# Patient Record
Sex: Male | Born: 1980 | Hispanic: Yes | Smoking: Former smoker
Health system: Southern US, Community
[De-identification: ages and names within clinical notes are randomized; demographics above are authoritative.]

## PROBLEM LIST (undated history)

## (undated) DIAGNOSIS — M791 Myalgia, unspecified site: Secondary | ICD-10-CM

## (undated) DIAGNOSIS — E079 Disorder of thyroid, unspecified: Secondary | ICD-10-CM

## (undated) HISTORY — DX: Myalgia, unspecified site: M79.10

## (undated) HISTORY — DX: Disorder of thyroid, unspecified: E07.9

---

## 2008-05-30 ENCOUNTER — Ambulatory Visit: Payer: Self-pay | Admitting: Family Medicine

## 2008-05-30 DIAGNOSIS — E039 Hypothyroidism, unspecified: Secondary | ICD-10-CM | POA: Insufficient documentation

## 2008-06-01 ENCOUNTER — Encounter: Payer: Self-pay | Admitting: Family Medicine

## 2008-06-01 LAB — CONVERTED CEMR LAB
TSH: 1.448 microintl units/mL (ref 0.350–4.50)
Total CK: 194 units/L (ref 7–232)

## 2008-06-27 ENCOUNTER — Encounter: Admission: RE | Admit: 2008-06-27 | Discharge: 2008-06-27 | Payer: Self-pay | Admitting: Family Medicine

## 2008-06-27 ENCOUNTER — Ambulatory Visit: Payer: Self-pay | Admitting: Family Medicine

## 2008-06-27 DIAGNOSIS — F329 Major depressive disorder, single episode, unspecified: Secondary | ICD-10-CM

## 2008-08-01 ENCOUNTER — Encounter: Admission: RE | Admit: 2008-08-01 | Discharge: 2008-08-01 | Payer: Self-pay | Admitting: Family Medicine

## 2008-08-01 ENCOUNTER — Ambulatory Visit: Payer: Self-pay | Admitting: Family Medicine

## 2008-08-01 DIAGNOSIS — M461 Sacroiliitis, not elsewhere classified: Secondary | ICD-10-CM

## 2009-04-07 ENCOUNTER — Ambulatory Visit: Payer: Self-pay | Admitting: Family Medicine

## 2009-04-21 ENCOUNTER — Encounter: Payer: Self-pay | Admitting: Family Medicine

## 2009-04-22 LAB — CONVERTED CEMR LAB
ALT: 131 units/L — ABNORMAL HIGH (ref 0–53)
AST: 38 units/L — ABNORMAL HIGH (ref 0–37)
Albumin: 4.6 g/dL (ref 3.5–5.2)
Calcium: 9.7 mg/dL (ref 8.4–10.5)
Chloride: 104 meq/L (ref 96–112)
Creatinine, Ser: 0.95 mg/dL (ref 0.40–1.50)
Potassium: 4.2 meq/L (ref 3.5–5.3)
TSH: 1.578 microintl units/mL (ref 0.350–4.500)
Total CHOL/HDL Ratio: 5.4

## 2009-04-24 ENCOUNTER — Encounter: Payer: Self-pay | Admitting: Family Medicine

## 2009-04-24 ENCOUNTER — Telehealth: Payer: Self-pay | Admitting: Family Medicine

## 2009-04-24 DIAGNOSIS — R748 Abnormal levels of other serum enzymes: Secondary | ICD-10-CM | POA: Insufficient documentation

## 2009-04-25 LAB — CONVERTED CEMR LAB
ALT: 103 units/L — ABNORMAL HIGH (ref 0–53)
Albumin: 4.8 g/dL (ref 3.5–5.2)
Alkaline Phosphatase: 98 units/L (ref 39–117)
HCV Ab: NEGATIVE
Hepatitis B Surface Ag: NEGATIVE
Total Protein: 7.9 g/dL (ref 6.0–8.3)

## 2009-04-28 ENCOUNTER — Encounter: Payer: Self-pay | Admitting: Family Medicine

## 2009-04-28 ENCOUNTER — Encounter: Admission: RE | Admit: 2009-04-28 | Discharge: 2009-04-28 | Payer: Self-pay | Admitting: Family Medicine

## 2009-04-28 DIAGNOSIS — K7689 Other specified diseases of liver: Secondary | ICD-10-CM

## 2009-04-28 DIAGNOSIS — K760 Fatty (change of) liver, not elsewhere classified: Secondary | ICD-10-CM | POA: Insufficient documentation

## 2009-06-01 ENCOUNTER — Ambulatory Visit: Payer: Self-pay | Admitting: Family Medicine

## 2009-10-06 ENCOUNTER — Ambulatory Visit: Payer: Self-pay | Admitting: Family Medicine

## 2009-10-09 ENCOUNTER — Encounter (INDEPENDENT_AMBULATORY_CARE_PROVIDER_SITE_OTHER): Payer: Self-pay | Admitting: *Deleted

## 2009-10-09 LAB — CONVERTED CEMR LAB
ALT: 217 units/L — ABNORMAL HIGH (ref 0–53)
AST: 64 units/L — ABNORMAL HIGH (ref 0–37)
Albumin: 5 g/dL (ref 3.5–5.2)
TSH: 2.098 microintl units/mL (ref 0.350–4.500)
Total Bilirubin: 0.8 mg/dL (ref 0.3–1.2)
Total Protein: 7.8 g/dL (ref 6.0–8.3)

## 2009-11-01 ENCOUNTER — Ambulatory Visit: Payer: Self-pay | Admitting: Family Medicine

## 2009-11-01 DIAGNOSIS — J0100 Acute maxillary sinusitis, unspecified: Secondary | ICD-10-CM

## 2009-11-06 ENCOUNTER — Ambulatory Visit: Payer: Self-pay | Admitting: Gastroenterology

## 2009-11-06 DIAGNOSIS — K5900 Constipation, unspecified: Secondary | ICD-10-CM | POA: Insufficient documentation

## 2009-11-09 LAB — CONVERTED CEMR LAB
A-1 Antitrypsin, Ser: 152 mg/dL (ref 83–200)
Angiotensin 1 Converting Enzyme: 38 units/L (ref 9–67)
Anti Nuclear Antibody(ANA): NEGATIVE
Ceruloplasmin: 30 mg/dL (ref 21–63)

## 2009-12-22 ENCOUNTER — Ambulatory Visit: Payer: Self-pay | Admitting: Gastroenterology

## 2010-12-04 NOTE — Assessment & Plan Note (Signed)
Summary: FATTY LIVER DISEASE/ELEVATED LFT'S--[facility]   History of Present Illness Visit Type: Initial Consult Primary GI MD: Elie Goody MD [facility] Primary Provider: Seymour Bars, DO Requesting Provider: Seymour Bars, DO Chief Complaint:  elevated LFTS c/o  gas, constipation History of Present Illness:   This is a 30 year old male with elevated liver function tests: AST of 30 and ALT of 131 in June 2010, AST of 64 and ALT of 217 in December 2010. Abdominal ultrasound showed findings consistent with fatty infiltration of the liver. He states for about 2-3 years he would drink heavily once a month, but he discontinued doing this about one year ago, and now only drinks an occasional alcoholic beverage. He has gained approximately 60 pounds since moving to the [place] from [place] in 2005. His triglycerides and cholesterol were elevated when they were last checked by Dr. Cathey Endow. Serologies for hepatitis B and C were negative. He relates intermittent problems with constipation and intestinal gas. He denies any change in stool caliber or rectal bleeding.   GI Review of Systems    Reports bloating.      Denies abdominal pain, acid reflux, belching, chest pain, dysphagia with liquids, dysphagia with solids, heartburn, loss of appetite, nausea, vomiting, vomiting blood, and  weight loss.      Reports change in bowel habits, constipation, and  liver problems.     Denies anal fissure, black tarry stools, diarrhea, diverticulosis, fecal incontinence, heme positive stool, hemorrhoids, irritable bowel syndrome, jaundice, light color stool, rectal bleeding, and  rectal pain.   Current Medications (verified): 1)  Levothyroxine Sodium 75 Mcg  Tabs (Levothyroxine Sodium) .... Take One Tab By Mouth Once Daily 2)  Fluoxetine Hcl 20 Mg Tabs (Fluoxetine Hcl) .Marland Kitchen.. 1 Tab By Mouth Daily 3)  Amoxicillin 875 Mg Tabs (Amoxicillin) .Marland Kitchen.. 1 Tab By Mouth Two Times A Day X 10 Days  Allergies (verified): 1)  Cymbalta  (Duloxetine Hcl)  Past History:  Past Surgical History: Last updated: 05/30/2008 none  Past Medical History: Hypothyroidism Depression Hepatic steatosis  Family History: Reviewed history from 04/07/2009 and no changes required. 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  Finished HS.  Moved to [place] from [place] in 2005. Has a 64 yo daughter who lives with her mom. Lives alone. Runs/ Lifts 2 x a wk. Never smoked. Rare ETOH use. Weight stable with fair diet. Illicit Drug Use - no Daily Caffeine Use  2 cups per day Drug Use:  no  Review of Systems       The patient complains of back pain, muscle pains/cramps, nosebleeds, sleeping problems, and vision changes.         The pertinent positives and negatives are noted as above and in the HPI. All other ROS were reviewed and were negative.   Vital Signs:  Patient profile:   30 year old male Height:      66 inches Weight:      193.38 pounds BMI:     31.33 Pulse rate:   68 / minute Pulse rhythm:   regular BP sitting:   130 / 84  (left arm)  Vitals Entered By: Milford Cage NCMA (November 06, 2009 1:47 PM)  Physical Exam  General:  Well developed, well nourished, no acute distress. obese.   Head:  Normocephalic and atraumatic. Eyes:  PERRLA, no icterus. Ears:  Normal auditory acuity. Mouth:  No deformity or  lesions, dentition normal. Neck:  Supple; no masses or thyromegaly. Lungs:  Clear throughout to auscultation. Heart:  Regular rate and rhythm; no murmurs, rubs,  or bruits. Abdomen:  Soft, nontender and nondistended. No masses, hepatosplenomegaly or hernias noted. Normal bowel sounds. Msk:  Symmetrical with no gross deformities. Normal posture. Pulses:  Normal pulses noted. Extremities:  No clubbing, cyanosis, edema or deformities noted. Neurologic:  Alert and  oriented x4;  grossly normal neurologically. Cervical Nodes:   No significant cervical adenopathy. Inguinal Nodes:  No significant inguinal adenopathy. Psych:  Alert and cooperative. Normal mood and affect.   Impression & Recommendations:  Problem # 1:  OTHER NONSPECIFIC ABNORMAL SERUM ENZYME LEVELS (ICD-790.5) This is very likely secondary to hepatic steatosis. Rule out other causes of elevated liver function tests. He will need a long-term weight loss program and management of his hyperlipidemia, through his primary care physician, to address his fatty liver. He is advised to discontinue alcohol usage and begin a low-fat, weight loss diet. Orders: TLB-Iron, (Fe) Total (83540-FE) TLB-IBC Pnl (Iron/FE;Transferrin) (83550-IBC) TLB-Ferritin (82728-FER) TLB-PT (Protime) (85610-PTP) T-Alpha-1-Antitrypsin Tot (06237-62831) T-AMA [phone]) T-ANA [phone]) T-Anti SMA (62703-50093) T-Ceruloplasmin [phone]) T-Angiotensin i-Converting Enzyme ([id number])  Patient Instructions: 1)  Get your labs drawn today in the basement.  2)  Constipation  brochure given.  3)  Copy sent to : Seymour Bars, DO 4)  The medication list was reviewed and reconciled.  All changed / newly prescribed medications were explained.  A complete medication list was provided to the patient / caregiver.

## 2010-12-04 NOTE — Assessment & Plan Note (Signed)
Summary: 4-6 WEEK F/U..AM.   History of Present Illness Visit Type: Follow-up Visit Primary GI MD: Elie Goody MD [facility] Primary Provider: Seymour Bars, DO Requesting Provider: Seymour Bars, DO Chief Complaint: follow-up fatty liver disease and elevated LFTs History of Present Illness:   This is a return visit for abnormal liver function tests. Serologic workup was only remarkable for an elevated ferritin of 446. Iron studies were normal. Ultrasound imaging revealed fatty infiltration of the liver   GI Review of Systems      Denies abdominal pain, acid reflux, belching, bloating, chest pain, dysphagia with liquids, dysphagia with solids, heartburn, loss of appetite, nausea, vomiting, vomiting blood, weight loss, and  weight gain.        Denies anal fissure, black tarry stools, change in bowel habit, constipation, diarrhea, diverticulosis, fecal incontinence, heme positive stool, hemorrhoids, irritable bowel syndrome, jaundice, light color stool, liver problems, rectal bleeding, and  rectal pain.   Current Medications (verified): 1)  Levothyroxine Sodium 75 Mcg  Tabs (Levothyroxine Sodium) .... Take One Tab By Mouth Once Daily 2)  Fluoxetine Hcl 20 Mg Tabs (Fluoxetine Hcl) .Marland Kitchen.. 1 Tab By Mouth Daily  Allergies (verified): 1)  Cymbalta (Duloxetine Hcl)  Past History:  Past Medical History: Reviewed history from 11/06/2009 and no changes required. Hypothyroidism Depression Hepatic steatosis  Past Surgical History: Reviewed history from 05/30/2008 and no changes required. none  Family History: M high chol F HTN 6 B and 1S healthy GF prostate cancer, died No FH of Colon Cancer:  Social History: Reviewed history from 11/06/2009 and no changes required. 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  Moved to [place] from [place] in 2005. Has a 79 yo daughter who lives with her mom. Lives alone. Runs/ Lifts 2 x a wk. Never smoked. Rare ETOH  use. Weight stable with fair diet. Illicit Drug Use - no Daily Caffeine Use  2 cups per day  Review of Systems       The pertinent positives and negatives are noted as above and in the HPI. All other ROS were reviewed and were negative.   Vital Signs:  Patient profile:   30 year old male Height:      66 inches Weight:      192.50 pounds BMI:     31.18 Pulse rate:   68 / minute Pulse rhythm:   regular BP sitting:   112 / 82  (left arm)  Vitals Entered By: Milford Cage NCMA (December 22, 2009 8:21 AM)  Physical Exam  General:  Well developed, well nourished, no acute distress. Head:  Normocephalic and atraumatic. Mouth:  No deformity or lesions, dentition normal. Lungs:  Clear throughout to auscultation. Heart:  Regular rate and rhythm; no murmurs, rubs,  or bruits. Abdomen:  Soft, nontender and nondistended. No masses, hepatosplenomegaly or hernias noted. Normal bowel sounds. Psych:  Alert and cooperative. Normal mood and affect.  Impression & Recommendations:  Problem # 1:  FATTY LIVER DISEASE (ICD-571.8) Hepatic steatosis leading to abnormal liver function tests. He is advised to maintain a low-fat diet, begin a regular exercise regimen and lose weight to achieve his ideal body weight. He is advised also to minimize alcohol. His primary physician will also supervise the above plan and I will see him for annual office visits. It would be reasonable to follow his liver function tests every 6 months for now. Will plan to repeat his iron studies and ferritin in one year given his elevated ferritin.  Patient Instructions: 1)  Fatty Liver handout given.  2)   Low Fat  Healthy Eating Plan brochure given.  3)  Copy sent to : Seymour Bars, DO 4)  Please schedule a follow-up appointment in 1 year. 5)  The medication list was reviewed and reconciled.  All changed / newly prescribed medications were explained.  A complete medication list was provided to the patient / caregiver.

## 2011-01-04 ENCOUNTER — Encounter: Payer: Self-pay | Admitting: Family Medicine

## 2011-01-04 ENCOUNTER — Encounter (INDEPENDENT_AMBULATORY_CARE_PROVIDER_SITE_OTHER): Payer: 59 | Admitting: Family Medicine

## 2011-01-04 DIAGNOSIS — Z0000 Encounter for general adult medical examination without abnormal findings: Secondary | ICD-10-CM

## 2011-01-10 NOTE — Assessment & Plan Note (Signed)
Summary: CPE   Vital Signs:  Patient profile:   30 year old male Height:      66 inches Weight:      196 pounds BMI:     31.75 O2 Sat:      98 % on Room air Pulse rate:   65 / minute BP sitting:   124 / 80  (left arm) Cuff size:   large  Vitals Entered By: Payton Spark CMA (January 04, 2011 10:04 AM)  O2 Flow:  Room air CC: CPE   Primary Care Provider:  Seymour Bars, DO  CC:  CPE.  History of Present Illness: 30 yo HM presents for CPE.  Doing well.  Hx of depression, hypothyroidism and fatty liver dz.  Has not been eating healthy or exercising but has a physically active job.  Tetanus updated 2010.  Due for fasting labs and TSH.  Stopped his Levothyroxine after running out last month and reports feeling better off Fluoxetine and Levothyroxine.  Denies fatigue, constipation or wt gain.  Sciatica has improved after seeing the chiropractor.  Denies fam hx of colon or prostate cancer or of premature heart dz.    Current Medications (verified): 1)  None  Allergies (verified): 1)  Cymbalta (Duloxetine Hcl)  Past History:  Past Medical History: Reviewed history from 11/06/2009 and no changes required. Hypothyroidism Depression Hepatic steatosis  Past Surgical History: Reviewed history from 05/30/2008 and no changes required. none  Family History: Reviewed history from 12/22/2009 and no changes required. M high chol F HTN 6 B and 1S healthy GF prostate cancer, died No FH of Colon Cancer:  Social History: Reviewed history from 11/06/2009 and no changes required. 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  Moved to [place] from [place] in 2005. Has a 51 yo daughter who lives with her mom. Lives alone. Runs/ Lifts 2 x a wk. Never smoked. Rare ETOH use. Weight stable with fair diet. Illicit Drug Use - no Daily Caffeine Use  2 cups per day  Review of Systems  The patient denies anorexia, fever, weight loss, weight gain, vision loss,  decreased hearing, hoarseness, chest pain, syncope, dyspnea on exertion, peripheral edema, prolonged cough, headaches, hemoptysis, abdominal pain, melena, hematochezia, severe indigestion/heartburn, hematuria, incontinence, genital sores, muscle weakness, suspicious skin lesions, transient blindness, difficulty walking, depression, unusual weight change, abnormal bleeding, enlarged lymph nodes, angioedema, breast masses, and testicular masses.    Physical Exam  General:  alert, well-developed, well-nourished, well-hydrated, and overweight-appearing.   Head:  normocephalic and atraumatic.   Eyes:  pupils equal, pupils round, and pupils reactive to light.   Ears:  EACs patent; TMs translucent and gray with good cone of light and bony landmarks.  Nose:  no nasal discharge.   Mouth:  good dentition and pharynx pink and moist.   Neck:  no masses.   Lungs:  Normal respiratory effort, chest expands symmetrically. Lungs are clear to auscultation, no crackles or wheezes. Heart:  Normal rate and regular rhythm. S1 and S2 normal without gallop, murmur, click, rub or other extra sounds. Abdomen:  soft, non-tender, normal bowel sounds, no distention, no masses, no guarding, no hepatomegaly, and no splenomegaly.   Pulses:  2+ radial and pedal pulses Extremities:  no LE edema Skin:  color normal.   Cervical Nodes:  No lymphadenopathy noted Psych:  good eye contact, not anxious appearing, and not depressed appearing.     Impression & Recommendations:  Problem # 1:  HEALTH MAINTENANCE EXAM (  ICD-V70.0) Keeping healthy checklist for men reviewed. BMI 31 c/w class I obesity. BP at goal.  Hx fatty liver dz. Immunizations UTD. Update fasting labs. Work on [employer], regular exercise, wt loss. Mood stable off Fluoxetine. Thyroid- off meds, due for TSH.  Other Orders: T-Comprehensive Metabolic Panel [phone]) T-Lipid Profile ([id number]) T-TSH [id number])  Patient Instructions: 1)  Update  fasting labs one morning (M-F 8-5), 8 hrs fasting. 2)  Will call you w/ results the following day. 3)  Return for f/u WT/ mood in 3-4 mos.   Orders Added: 1)  T-Comprehensive Metabolic Panel [[id number]] 2)  T-Lipid Profile [[id number]] 3)  T-TSH [[id number]] 4)  Est. Patient age 54-39 [phone]

## 2012-03-03 ENCOUNTER — Ambulatory Visit (INDEPENDENT_AMBULATORY_CARE_PROVIDER_SITE_OTHER): Payer: 59 | Admitting: Family Medicine

## 2012-03-03 ENCOUNTER — Encounter: Payer: Self-pay | Admitting: Family Medicine

## 2012-03-03 VITALS — BP 139/89 | HR 70 | Ht 66.0 in | Wt 195.0 lb

## 2012-03-03 DIAGNOSIS — Z3009 Encounter for other general counseling and advice on contraception: Secondary | ICD-10-CM

## 2012-03-03 DIAGNOSIS — Z0000 Encounter for general adult medical examination without abnormal findings: Secondary | ICD-10-CM

## 2012-03-03 LAB — POCT URINALYSIS DIPSTICK
Blood, UA: NEGATIVE
Leukocytes, UA: NEGATIVE
Nitrite, UA: NEGATIVE
Protein, UA: NEGATIVE
pH, UA: 6.5

## 2012-03-03 NOTE — Progress Notes (Signed)
Subjective:    Patient ID: Tyrone Johnson, male    DOB: 1981/02/05, 31 y.o.   MRN: [id number]  HPI He is here for physical examination. History the past of a thyroid inactivity and mother has borderline diabetes.   Review of Systems  Musculoskeletal: Positive for myalgias and arthralgias.       He sees a Land or basis for joint manipulation.  Psychiatric/Behavioral:       He reports his wife is concerned that he may be bipolar and if there is anyway he can be tested for it.  All other systems reviewed and are negative.    Allergies  Allergen Reactions  . Duloxetine     REACTION: palpitations   History   Social History  . Marital Status: Married    Spouse Name: N/A    Number of Children: N/A  . Years of Education: N/A   Occupational History  . Not on file.   Social History Main Topics  . Smoking status: Former Games developer  . Smokeless tobacco: Never Used  . Alcohol Use: Not on file  . Drug Use: No  . Sexually Active: Yes    Birth Control/ Protection: Condom, Rhythm   Other Topics Concern  . Not on file   Social History Narrative  . No narrative on file   Family History  Problem Relation Age of Onset  . Diabetes Mother   . Ulcers Father   . Eczema Son    Past Medical History  Diagnosis Date  . Thyroid disease   . Myalgia    History reviewed. No pertinent past surgical history.     BP 139/89  Pulse 70  Ht 5\' 6"  (1.676 m)  Wt 195 lb (88.451 kg)  BMI 31.47 kg/m2  SpO2 100% Objective:   Physical Exam  Constitutional: He is oriented to person, place, and time. He appears well-developed and well-nourished.  HENT:  Head: Normocephalic and atraumatic.  Right Ear: External ear normal.  Left Ear: External ear normal.  Nose: Nose normal.  Mouth/Throat: Oropharynx is clear and moist.  Eyes: Conjunctivae are normal. Pupils are equal, round, and reactive to light.  Fundoscopic exam:      The right eye shows no arteriolar narrowing, no AV nicking,  no exudate and no hemorrhage.       The left eye shows no arteriolar narrowing, no AV nicking, no exudate and no hemorrhage.  Neck: Normal range of motion. Neck supple. No tracheal deviation present. No thyromegaly present.  Cardiovascular: Normal rate and normal heart sounds.  Exam reveals no gallop.   No murmur heard. Pulmonary/Chest: Effort normal and breath sounds normal. No respiratory distress. He has no wheezes. He exhibits no tenderness.  Abdominal: Soft. Bowel sounds are normal. He exhibits no distension. There is no tenderness. Hernia confirmed negative in the right inguinal area and confirmed negative in the left inguinal area.  Genitourinary: Testes normal and penis normal. Uncircumcised. No penile tenderness.       Per his preference we'll hold off on rectal exam and prostate exam until over 30.  Musculoskeletal: Normal range of motion. He exhibits no edema.       Mild right lumbar muscle tenderness to palpation.  Lymphadenopathy:    He has no cervical adenopathy.  Neurological: He is alert and oriented to person, place, and time. He has normal reflexes. No cranial nerve deficit.  Skin: Skin is warm and dry. No erythema.  Psychiatric: He has a normal mood and affect. His  behavior is normal.      Mental health evaluation for bipolar was positive for 8 much the neck her multiple times or together and he rates it as being minor so he would not qualify for mental health referral at this time unless things changed.     Results for orders placed in visit on 03/03/12  POCT URINALYSIS DIPSTICK      Component Value Range   Color, UA yellow     Clarity, UA clear     Glucose, UA neg     Bilirubin, UA neg     Ketones, UA neg     Spec Grav, UA 1.010     Blood, UA neg     pH, UA 6.5     Protein, UA neg     Urobilinogen, UA 0.2     Nitrite, UA neg     Leukocytes, UA Negative     Assessment & Plan:  #1 health maintenance. Will obtain lipid, A1c, CMP, CBC, testosterone and  TSH. #2 referral for vasectomy counseling done. #3 concern about bipolar symptoms by his wife. At this time spent to if symptoms gets worse please return. #4 myalgia/joint pain. Continue following up with chiropractor We'll see back in the office in 12-15 months for PE

## 2012-03-03 NOTE — Patient Instructions (Signed)
Vasectomy A vasectomy is tying (with or without cutting) the tube that collects the sperm from the testicle (vas deferens). The vasectomy blocks the sperm from going through the vas deferens and penis so that during sexual intercourse, the sperm does not go into the vagina. Vasectomy is safe, with very rare complications. It does not affect your sexual desire or performance.  Since vasectomy is considered permanent, you should not have it done until you are sure you do not want any more children. You and your partner should be in full agreement to have the procedure. Your decision to have a vasectomy should not be made during a stressful situation. This includes loss of a pregnancy, illness, death of a spouse or divorce. There are other means of contraception that can be used until you are completely sure you want this procedure done. A vasectomy does not protect you from sexually transmitted disease. Men who want the vasectomy reversed need an operation. It may not be successful. A vasectomy has less risk and is less expensive than a woman having a tubal sterilization. LET YOUR CAREGIVER KNOW ABOUT:   Allergies.   Medications taken including herbs, eye drops, over the counter medications, and creams.   Use of steroids (by mouth or creams).   Past problems with anesthetics or Novocaine.   History of blood clots (thrombophlebitis).   History of bleeding or blood problems.   Past surgery.  RISKS AND COMPLICATIONS  Failure of the procedure to cause infertility. This means you would still be able to get a male pregnant.   Post-operative pain. This can usually be controlled with medicine.   Infection. A germ starts growing in the wound. This can usually be treated with antibiotics.   An allergic reaction to the anesthetic or other medicine given.   Bleeding. Blood may seep under the skin so that the scrotum and penis appear to be bruised. Sometimes the scrotum can swell and get the size of  a grapefruit. This usually disappears without treatment within a week or two.  PROCEDURE  The scrotum is cleaned with bacterial killing soap and the caregiver finds the vas deferens.   Each side of the scrotum is numbed.   A very small cut (incision) is made, and the vas deferens are pulled out of the scrotum. The vas deferens are then tied off, cut or may be burned (cauterized) at the ends.   Sometimes the vas deferens are pulled out from the scrotum through a puncture wound. This is done with a special instrument without an incision.   The vas deferens are then put back into the scrotum, and the incision or puncture wound is closed.   After surgery, sperm may still be left in the vas deferens for 1 to 3 months. Because of this, other means of contraception should be used until your caregiver examines you and finds there are no sperm in your seminal fluid.   Castration is another method that is the surgical removal of both testicles. It causes sterilization in men.  Having a vasectomy should be discussed with your care giver with you and your partner present. Ask questions and talk about your concerns with your caregiver. Then, you can decide if the operation is safe for you. You can change your mind and cancel the surgery at any time. HOME CARE INSTRUCTIONS   Only take over-the-counter or prescription medicines for pain, discomfort or fever as directed by your caregiver.   An ice pack for 15 to 20 minutes,   3 to 4 times per day will help decrease swelling.   Wearing supportive briefs or an athletic supporter will help decrease swelling.   Avoid being active for the first two days after surgery.   Keep the incisions clean and covered to prevent infection.   Some oozing of blood from the cut (incision) is normal during the first day or two after surgery.   Do not participate in sports or do heavy physical labor for at least two weeks.   Follow your caregivers instructions regarding  diet, rest, work, social and sexual activities and follow up appointments.  SEEK IMMEDIATE MEDICAL CARE IF:   There is redness, swelling, or increasing pain in the wounds or testicles.   Pus is coming from wound.   An unexplained oral temperature above 102 F (38.9 C) develops, or as directed by your caregiver.   You notice a bad smell coming from the wound or material covering the wound (dressing).   There is a breaking open of the stitches (suture) or wound edges even after sutures have been removed. Sometimes these incisions are not sutured.   There is increased bleeding from the wounds.  MAKE SURE YOU:   Understand these instructions.   Will watch your condition.   Will get help right away if you are not doing well or get worse.  Document Released: 01/11/2003 Document Revised: 10/10/2011 Document Reviewed: 03/08/2008 [employer] Patient Information 2012 [employer]. 

## 2012-03-04 LAB — COMPREHENSIVE METABOLIC PANEL
AST: 35 U/L (ref 0–37)
Albumin: 4.7 g/dL (ref 3.5–5.2)
BUN: 13 mg/dL (ref 6–23)
Calcium: 9.7 mg/dL (ref 8.4–10.5)
Chloride: 104 mEq/L (ref 96–112)
Glucose, Bld: 90 mg/dL (ref 70–99)
Potassium: 4.3 mEq/L (ref 3.5–5.3)
Sodium: 144 mEq/L (ref 135–145)
Total Protein: 7.1 g/dL (ref 6.0–8.3)

## 2012-03-04 LAB — LIPID PANEL
HDL: 42 mg/dL (ref >39)
LDL Cholesterol: 148 mg/dL — ABNORMAL HIGH (ref 0–99)
Triglycerides: 203 mg/dL — ABNORMAL HIGH (ref <150)

## 2012-03-04 LAB — CBC
Hemoglobin: 15 g/dL (ref 13.0–17.0)
MCH: 31.1 pg (ref 26.0–34.0)
Platelets: 201 10*3/uL (ref 150–400)
RBC: 4.82 MIL/uL (ref 4.22–5.81)
WBC: 5.4 10*3/uL (ref 4.0–10.5)

## 2012-03-04 LAB — HEMOGLOBIN A1C: Hgb A1c MFr Bld: 5.5 % (ref <5.7)

## 2012-03-05 ENCOUNTER — Encounter: Payer: Self-pay | Admitting: Family Medicine

## 2013-01-15 ENCOUNTER — Ambulatory Visit (INDEPENDENT_AMBULATORY_CARE_PROVIDER_SITE_OTHER): Payer: 59 | Admitting: Physician Assistant

## 2013-01-15 ENCOUNTER — Encounter: Payer: Self-pay | Admitting: Physician Assistant

## 2013-01-15 VITALS — BP 130/81 | HR 87 | Wt 204.0 lb

## 2013-01-15 DIAGNOSIS — R748 Abnormal levels of other serum enzymes: Secondary | ICD-10-CM

## 2013-01-15 DIAGNOSIS — R5381 Other malaise: Secondary | ICD-10-CM

## 2013-01-15 DIAGNOSIS — E785 Hyperlipidemia, unspecified: Secondary | ICD-10-CM

## 2013-01-15 DIAGNOSIS — F411 Generalized anxiety disorder: Secondary | ICD-10-CM

## 2013-01-15 DIAGNOSIS — F329 Major depressive disorder, single episode, unspecified: Secondary | ICD-10-CM

## 2013-01-15 NOTE — Patient Instructions (Addendum)
Really need to focus on exercise and low fat diet. Will call with lab results.    Fat and Cholesterol Control Diet Cholesterol levels in your body are determined significantly by your diet. Cholesterol levels may also be related to heart disease. The following material helps to explain this relationship and discusses what you can do to help keep your heart healthy. Not all cholesterol is bad. Low-density lipoprotein (LDL) cholesterol is the "bad" cholesterol. It may cause fatty deposits to build up inside your arteries. High-density lipoprotein (HDL) cholesterol is "good." It helps to remove the "bad" LDL cholesterol from your blood. Cholesterol is a very important risk factor for heart disease. Other risk factors are high blood pressure, smoking, stress, heredity, and weight. The heart muscle gets its supply of blood through the coronary arteries. If your LDL cholesterol is high and your HDL cholesterol is low, you are at risk for having fatty deposits build up in your coronary arteries. This leaves less room through which blood can flow. Without sufficient blood and oxygen, the heart muscle cannot function properly and you may feel chest pains (angina pectoris). When a coronary artery closes up entirely, a part of the heart muscle may die causing a heart attack (myocardial infarction). CHECKING CHOLESTEROL When your caregiver sends your blood to a lab to be examined for cholesterol, a complete lipid (fat) profile may be done. With this test, the total amount of cholesterol and levels of LDL and HDL are determined. Triglycerides are a type of fat that circulates in the blood. They can also be used to determine heart disease risk. The list below describes what the numbers should be: Test: Total Cholesterol.  Less than 200 mg/dl. Test: LDL "bad cholesterol."  Less than 100 mg/dl.  Less than 70 mg/dl if you are at very high risk of a heart attack or sudden cardiac death. Test: HDL "good  cholesterol."  Greater than 50 mg/dl for women.  Greater than 40 mg/dl for men. Test: Triglycerides.  Less than 150 mg/dl. CONTROLLING CHOLESTEROL WITH DIET Although exercise and lifestyle factors are important, your diet is key. That is because certain foods are known to raise cholesterol and others to lower it. The goal is to balance foods for their effect on cholesterol and more importantly, to replace saturated and trans fat with other types of fat, such as monounsaturated fat, polyunsaturated fat, and omega-3 fatty acids. On average, a person should consume no more than 15 to 17 g of saturated fat daily. Saturated and trans fats are considered "bad" fats, and they will raise LDL cholesterol. Saturated fats are primarily found in animal products such as meats, butter, and cream. However, that does not mean you need to give up all your favorite foods. Today, there are good tasting, low-fat, low-cholesterol substitutes for most of the things you like to eat. Choose low-fat or nonfat alternatives. Choose round or loin cuts of red meat. These types of cuts are lowest in fat and cholesterol. Chicken (without the skin), fish, veal, and ground [place] breast are great choices. Eliminate fatty meats, such as hot dogs and salami. Even shellfish have little or no saturated fat. Have a 3 oz (85 g) portion when you eat lean meat, poultry, or fish. Trans fats are also called "partially hydrogenated oils." They are oils that have been scientifically manipulated so that they are solid at room temperature resulting in a longer shelf life and improved taste and texture of foods in which they are added. Trans fats are  found in stick margarine, some tub margarines, cookies, crackers, and baked goods.  When baking and cooking, oils are a great substitute for butter. The monounsaturated oils are especially beneficial since it is believed they lower LDL and raise HDL. The oils you should avoid entirely are saturated  tropical oils, such as coconut and palm.  Remember to eat a lot from food groups that are naturally free of saturated and trans fat, including fish, fruit, vegetables, beans, grains (barley, rice, couscous, bulgur wheat), and pasta (without cream sauces).  IDENTIFYING FOODS THAT LOWER CHOLESTEROL  Soluble fiber may lower your cholesterol. This type of fiber is found in fruits such as apples, vegetables such as broccoli, potatoes, and carrots, legumes such as beans, peas, and lentils, and grains such as barley. Foods fortified with plant sterols (phytosterol) may also lower cholesterol. You should eat at least 2 g per day of these foods for a cholesterol lowering effect.  Read package labels to identify low-saturated fats, trans fat free, and low-fat foods at the supermarket. Select cheeses that have only 2 to 3 g saturated fat per ounce. Use a heart-healthy tub margarine that is free of trans fats or partially hydrogenated oil. When buying baked goods (cookies, crackers), avoid partially hydrogenated oils. Breads and muffins should be made from whole grains (whole-wheat or whole oat flour, instead of "flour" or "enriched flour"). Buy non-creamy canned soups with reduced salt and no added fats.  FOOD PREPARATION TECHNIQUES  Never deep-fry. If you must fry, either stir-fry, which uses very little fat, or use non-stick cooking sprays. When possible, broil, bake, or roast meats, and steam vegetables. Instead of putting butter or margarine on vegetables, use lemon and herbs, applesauce, and cinnamon (for squash and sweet potatoes), nonfat yogurt, salsa, and low-fat dressings for salads.  LOW-SATURATED FAT / LOW-FAT FOOD SUBSTITUTES Meats / Saturated Fat (g)  Avoid: Steak, marbled (3 oz/85 g) / 11 g  Choose: Steak, lean (3 oz/85 g) / 4 g  Avoid: Hamburger (3 oz/85 g) / 7 g  Choose: Hamburger, lean (3 oz/85 g) / 5 g  Avoid: Ham (3 oz/85 g) / 6 g  Choose: Ham, lean cut (3 oz/85 g) / 2.4 g  Avoid:  Chicken, with skin, dark meat (3 oz/85 g) / 4 g  Choose: Chicken, skin removed, dark meat (3 oz/85 g) / 2 g  Avoid: Chicken, with skin, light meat (3 oz/85 g) / 2.5 g  Choose: Chicken, skin removed, light meat (3 oz/85 g) / 1 g Dairy / Saturated Fat (g)  Avoid: Whole milk (1 cup) / 5 g  Choose: Low-fat milk, 2% (1 cup) / 3 g  Choose: Low-fat milk, 1% (1 cup) / 1.5 g  Choose: Skim milk (1 cup) / 0.3 g  Avoid: Hard cheese (1 oz/28 g) / 6 g  Choose: Skim milk cheese (1 oz/28 g) / 2 to 3 g  Avoid: Cottage cheese, 4% fat (1 cup) / 6.5 g  Choose: Low-fat cottage cheese, 1% fat (1 cup) / 1.5 g  Avoid: Ice cream (1 cup) / 9 g  Choose: Sherbet (1 cup) / 2.5 g  Choose: Nonfat frozen yogurt (1 cup) / 0.3 g  Choose: Frozen fruit bar / trace  Avoid: Whipped cream (1 tbs) / 3.5 g  Choose: Nondairy whipped topping (1 tbs) / 1 g Condiments / Saturated Fat (g)  Avoid: Mayonnaise (1 tbs) / 2 g  Choose: Low-fat mayonnaise (1 tbs) / 1 g  Avoid: Butter (1 tbs) /  7 g  Choose: Extra light margarine (1 tbs) / 1 g  Avoid: Coconut oil (1 tbs) / 11.8 g  Choose: Olive oil (1 tbs) / 1.8 g  Choose: Corn oil (1 tbs) / 1.7 g  Choose: Safflower oil (1 tbs) / 1.2 g  Choose: Sunflower oil (1 tbs) / 1.4 g  Choose: Soybean oil (1 tbs) / 2.4 g  Choose: Canola oil (1 tbs) / 1 g Document Released: 10/21/2005 Document Revised: 01/13/2012 Document Reviewed: 04/11/2011 [facility] Patient Information 2013 [place], [place].

## 2013-01-15 NOTE — Progress Notes (Signed)
  Subjective:    Patient ID: Tyrone Johnson, male    DOB: 29-Dec-1980, 32 y.o.   MRN: [id number]  HPI Patient is a 32 year old male who presents to the clinic to get back on the right track after stopping all medications last year. He has previously been diagnosed with hyperthyroidism, depression, elevated liver and, hyperlipidemia and has not been on any medications in the last year. He is ready to start taking care of himself and making sure everything is under control. He has noticed that he feels very tired most days. He continues to work in hopes down a job but exhausted all the time. His mood is also very up and down. He is very easily angered. He also has days where he just feels like he does only get out of bed. He denies any exercise and has not watching his diet. He denies any chest pains, palpitations, shortness of breath. He denies any suicidal thoughts or harm towards other. He has been on antidepressant in the past but he feels like it might have caused his heart to beat faster than normal. Patient request lab work.    Review of Systems  Constitutional: Positive for fatigue. Negative for fever, chills, activity change and appetite change.  HENT: Negative.   Eyes: Negative.   Respiratory: Negative.   Cardiovascular: Negative.   Gastrointestinal: Negative.   Endocrine: Negative.   Genitourinary: Negative.   Musculoskeletal: Negative.   Skin: Negative.   Allergic/Immunologic: Negative.   Neurological: Negative.   Hematological: Negative.        Objective:   Physical Exam  Constitutional: He is oriented to person, place, and time. He appears well-developed and well-nourished.  HENT:  Head: Normocephalic and atraumatic.  Eyes: Conjunctivae are normal.  Neck: Normal range of motion. Neck supple. No thyromegaly present.  Cardiovascular: Normal rate, regular rhythm and normal heart sounds.   Pulmonary/Chest: Effort normal and breath sounds normal.  Neurological: He is  alert and oriented to person, place, and time.  Skin: Skin is warm and dry.  Psychiatric: He has a normal mood and affect. His behavior is normal.          Assessment & Plan:  Hypothyroidism-we'll check TSH today and see what appropriate dose patient needs to be started at.  Fatty liver disease-patient has a history of fatty liver disease discuss with patient notes most likely why his enzymes were elevated. Will recheck liver enzymes today.   depression/anxiety-will consider medications after we have labs random. Discussed with patient possibility of testosterone imbalance. We'll check testosterone today. These symptoms also to be affected by thyroid level. Patient aware  Hyperlipidemia-the past patient LDLs were elevated. Will recheck when patient has been fasting for 8 hours. Gave lab slip to get done next week. Discussed low fat diet and regular exercise. Patient seems to understand and want to take control of his helped.

## 2013-01-19 LAB — COMPREHENSIVE METABOLIC PANEL
ALT: 135 U/L — ABNORMAL HIGH (ref 0–53)
AST: 38 U/L — ABNORMAL HIGH (ref 0–37)
Albumin: 4.7 g/dL (ref 3.5–5.2)
Alkaline Phosphatase: 89 U/L (ref 39–117)
BUN: 14 mg/dL (ref 6–23)
CO2: 29 mEq/L (ref 19–32)
Calcium: 9.5 mg/dL (ref 8.4–10.5)
Chloride: 104 mEq/L (ref 96–112)
Creat: 0.97 mg/dL (ref 0.50–1.35)
Glucose, Bld: 87 mg/dL (ref 70–99)
Potassium: 4.2 mEq/L (ref 3.5–5.3)
Sodium: 141 mEq/L (ref 135–145)
Total Bilirubin: 0.6 mg/dL (ref 0.3–1.2)
Total Protein: 7.3 g/dL (ref 6.0–8.3)

## 2013-01-19 LAB — CBC
MCV: 90.4 fL (ref 78.0–100.0)
Platelets: 223 10*3/uL (ref 150–400)
RBC: 5.01 MIL/uL (ref 4.22–5.81)
RDW: 13.9 % (ref 11.5–15.5)
WBC: 5.3 10*3/uL (ref 4.0–10.5)

## 2013-01-19 LAB — LIPID PANEL
Cholesterol: 221 mg/dL — ABNORMAL HIGH (ref 0–200)
HDL: 43 mg/dL (ref >39)
Total CHOL/HDL Ratio: 5.1 Ratio
VLDL: 33 mg/dL (ref 0–40)

## 2013-01-19 LAB — TSH: TSH: 5.964 u[IU]/mL — ABNORMAL HIGH (ref 0.350–4.500)

## 2013-01-20 ENCOUNTER — Other Ambulatory Visit: Payer: Self-pay | Admitting: Physician Assistant

## 2013-01-20 LAB — TESTOSTERONE, FREE, TOTAL, SHBG
Sex Hormone Binding: 55 nmol/L (ref 13–71)
Testosterone, Free: 99.8 pg/mL (ref 47.0–244.0)
Testosterone-% Free: 1.6 % (ref 1.6–2.9)
Testosterone: 642 ng/dL (ref 300–890)

## 2013-01-20 LAB — VITAMIN D 25 HYDROXY (VIT D DEFICIENCY, FRACTURES): Vit D, 25-Hydroxy: 18 ng/mL — ABNORMAL LOW (ref 30–89)

## 2013-01-20 MED ORDER — ERGOCALCIFEROL 1.25 MG (50000 UT) PO CAPS: 50000.0000 [IU] | ORAL_CAPSULE | ORAL | Status: DC

## 2013-01-20 MED ORDER — LEVOTHYROXINE SODIUM 75 MCG PO TABS: 75.0000 ug | ORAL_TABLET | Freq: Every day | ORAL | Status: DC

## 2013-03-01 ENCOUNTER — Ambulatory Visit (INDEPENDENT_AMBULATORY_CARE_PROVIDER_SITE_OTHER): Payer: 59 | Admitting: Physician Assistant

## 2013-03-01 ENCOUNTER — Encounter: Payer: Self-pay | Admitting: Physician Assistant

## 2013-03-01 VITALS — BP 131/88 | HR 61 | Wt 197.0 lb

## 2013-03-01 DIAGNOSIS — Z0000 Encounter for general adult medical examination without abnormal findings: Secondary | ICD-10-CM

## 2013-03-01 DIAGNOSIS — E559 Vitamin D deficiency, unspecified: Secondary | ICD-10-CM

## 2013-03-01 DIAGNOSIS — E039 Hypothyroidism, unspecified: Secondary | ICD-10-CM

## 2013-03-01 DIAGNOSIS — K7689 Other specified diseases of liver: Secondary | ICD-10-CM

## 2013-03-01 NOTE — Progress Notes (Signed)
  Subjective:    Patient ID: Tyrone Johnson, male    DOB: [birth year], 32 y.o.   MRN: [id number]  HPI Patient presents to the clinic for CPE. Labs have already been drawn and patient is aware of results.   Hyperlipidemia stable and working to improve with diet and exercise. Has lost 8lbs since last visit. Drinking more water and joined the gym.  Vitamin D defiency taking 50,000 units for 8 weeks then will recheck.   Hypothyroidism started daily levothyroxine and will recheck in next 3-4 weeks.   Fatty liver with elevated liver enzymes, persistently.   Patient has no compliants today. He has more energy and feels like his anger is more controlled and mood more even.   Social, surgical, family history updated.    Review of Systems  All other systems reviewed and are negative.       Objective:   Physical Exam  BP 131/88  Pulse 61  Wt 197 lb (89.359 kg)  BMI 31.81 kg/m2  General Appearance:    Alert, cooperative, no distress, appears stated age  Head:    Normocephalic, without obvious abnormality, atraumatic  Eyes:    PERRL, conjunctiva/corneas clear, EOM's intact, fundi    benign, both eyes       Ears:    Normal TM's and external ear canals, both ears  Nose:   Nares normal, septum midline, mucosa normal, no drainage    or sinus tenderness  Throat:   Lips, mucosa, and tongue normal; teeth and gums normal  Neck:   Supple, symmetrical, trachea midline, no adenopathy;       thyroid:  No enlargement/tenderness/nodules; no carotid   bruit or JVD  Back:     Symmetric, no curvature, ROM normal, no CVA tenderness  Lungs:     Clear to auscultation bilaterally, respirations unlabored  Chest wall:    No tenderness or deformity  Heart:    Regular rate and rhythm, S1 and S2 normal, no murmur, rub   or gallop  Abdomen:     Soft, non-tender, bowel sounds active all four quadrants,    no masses, no organomegaly  Genitalia:    Not done.   Rectal:    Not done.   Extremities:    Extremities normal, atraumatic, no cyanosis or edema  Pulses:   2+ and symmetric all extremities  Skin:   Skin color, texture, turgor normal, no rashes or lesions  Lymph nodes:   Cervical, supraclavicular, and axillary nodes normal  Neurologic:   CNII-XII intact. Normal strength, sensation and reflexes      throughout        Assessment & Plan:   CPE- Vaccines are up to date.   Hyperlipidemia- diet and exercise will continue to monitor.   Hypothyroidism/vitamin D- gave lab slip to have check within in month and will give refills accordingly.   Fatty liver- Reassured pt that elevated liver enzymes had been worked up and due to fatty liver. Losing weight can only help.

## 2013-03-01 NOTE — Patient Instructions (Addendum)
Balance diet exercise. Goal in 6 months 175.   Low Back Sprain with Rehab  A sprain is an injury in which a ligament is torn. The ligaments of the lower back are vulnerable to sprains. However, they are strong and require great force to be injured. These ligaments are important for stabilizing the spinal column. Sprains are classified into three categories. Grade 1 sprains cause pain, but the tendon is not lengthened. Grade 2 sprains include a lengthened ligament, due to the ligament being stretched or partially ruptured. With grade 2 sprains there is still function, although the function may be decreased. Grade 3 sprains involve a complete tear of the tendon or muscle, and function is usually impaired. SYMPTOMS   Severe pain in the lower back.  Sometimes, a feeling of a "pop," "snap," or tear, at the time of injury.  Tenderness and sometimes swelling at the injury site.  Uncommonly, bruising (contusion) within 48 hours of injury.  Muscle spasms in the back. CAUSES  Low back sprains occur when a force is placed on the ligaments that is greater than they can handle. Common causes of injury include:  Performing a stressful act while off-balance.  Repetitive stressful activities that involve movement of the lower back.  Direct hit (trauma) to the lower back. RISK INCREASES WITH:  Contact sports (football, wrestling).  Collisions (major skiing accidents).  Sports that require throwing or lifting (baseball, weightlifting).  Sports involving twisting of the spine (gymnastics, diving, tennis, golf).  Poor strength and flexibility.  Inadequate protection.  Previous back injury or surgery (especially fusion). PREVENTION  Wear properly fitted and padded protective equipment.  Warm up and stretch properly before activity.  Allow for adequate recovery between workouts.  Maintain physical fitness:  Strength, flexibility, and endurance.  Cardiovascular fitness.  Maintain a  healthy body weight. PROGNOSIS  If treated properly, low back sprains usually heal with non-surgical treatment. The length of time for healing depends on the severity of the injury.  RELATED COMPLICATIONS   Recurring symptoms, resulting in a chronic problem.  Chronic inflammation and pain in the low back.  Delayed healing or resolution of symptoms, especially if activity is resumed too soon.  Prolonged impairment.  Unstable or arthritic joints of the low back. TREATMENT  Treatment first involves the use of ice and medicine, to reduce pain and inflammation. The use of strengthening and stretching exercises may help reduce pain with activity. These exercises may be performed at home or with a therapist. Severe injuries may require referral to a therapist for further evaluation and treatment, such as ultrasound. Your caregiver may advise that you wear a back brace or corset, to help reduce pain and discomfort. Often, prolonged bed rest results in greater harm then benefit. Corticosteroid injections may be recommended. However, these should be reserved for the most serious cases. It is important to avoid using your back when lifting objects. At night, sleep on your back on a firm mattress, with a pillow placed under your knees. If non-surgical treatment is unsuccessful, surgery may be needed.  MEDICATION   If pain medicine is needed, nonsteroidal anti-inflammatory medicines (aspirin and ibuprofen), or other minor pain relievers (acetaminophen), are often advised.  Do not take pain medicine for 7 days before surgery.  Prescription pain relievers may be given, if your caregiver thinks they are needed. Use only as directed and only as much as you need.  Ointments applied to the skin may be helpful.  Corticosteroid injections may be given by your caregiver.  These injections should be reserved for the most serious cases, because they may only be given a certain number of times. HEAT AND  COLD  Cold treatment (icing) should be applied for 10 to 15 minutes every 2 to 3 hours for inflammation and pain, and immediately after activity that aggravates your symptoms. Use ice packs or an ice massage.  Heat treatment may be used before performing stretching and strengthening activities prescribed by your caregiver, physical therapist, or athletic trainer. Use a heat pack or a warm water soak. SEEK MEDICAL CARE IF:   Symptoms get worse or do not improve in 2 to 4 weeks, despite treatment.  You develop numbness or weakness in either leg.  You lose bowel or bladder function.  Any of the following occur after surgery: fever, increased pain, swelling, redness, drainage of fluids, or bleeding in the affected area.  New, unexplained symptoms develop. (Drugs used in treatment may produce side effects.) EXERCISES  RANGE OF MOTION (ROM) AND STRETCHING EXERCISES - Low Back Sprain Most people with lower back pain will find that their symptoms get worse with excessive bending forward (flexion) or arching at the lower back (extension). The exercises that will help resolve your symptoms will focus on the opposite motion.  Your physician, physical therapist or athletic trainer will help you determine which exercises will be most helpful to resolve your lower back pain. Do not complete any exercises without first consulting with your caregiver. Discontinue any exercises which make your symptoms worse, until you speak to your caregiver. If you have pain, numbness or tingling which travels down into your buttocks, leg or foot, the goal of the therapy is for these symptoms to move closer to your back and eventually resolve. Sometimes, these leg symptoms will get better, but your lower back pain may worsen. This is often an indication of progress in your rehabilitation. Be very alert to any changes in your symptoms and the activities in which you participated in the 24 hours prior to the change. Sharing this  information with your caregiver will allow him or her to most efficiently treat your condition. These exercises may help you when beginning to rehabilitate your injury. Your symptoms may resolve with or without further involvement from your physician, physical therapist or athletic trainer. While completing these exercises, remember:   Restoring tissue flexibility helps normal motion to return to the joints. This allows healthier, less painful movement and activity.  An effective stretch should be held for at least 30 seconds.  A stretch should never be painful. You should only feel a gentle lengthening or release in the stretched tissue. FLEXION RANGE OF MOTION AND STRETCHING EXERCISES: STRETCH  Flexion, Single Knee to Chest   Lie on a firm bed or floor with both legs extended in front of you.  Keeping one leg in contact with the floor, bring your opposite knee to your chest. Hold your leg in place by either grabbing behind your thigh or at your knee.  Pull until you feel a gentle stretch in your low back. Hold __________ seconds.  Slowly release your grasp and repeat the exercise with the opposite side. Repeat __________ times. Complete this exercise __________ times per day.  STRETCH  Flexion, Double Knee to Chest  Lie on a firm bed or floor with both legs extended in front of you.  Keeping one leg in contact with the floor, bring your opposite knee to your chest.  Tense your stomach muscles to support your back and then  lift your other knee to your chest. Hold your legs in place by either grabbing behind your thighs or at your knees.  Pull both knees toward your chest until you feel a gentle stretch in your low back. Hold __________ seconds.  Tense your stomach muscles and slowly return one leg at a time to the floor. Repeat __________ times. Complete this exercise __________ times per day.  STRETCH  Low Trunk Rotation  Lie on a firm bed or floor. Keeping your legs in front of  you, bend your knees so they are both pointed toward the ceiling and your feet are flat on the floor.  Extend your arms out to the side. This will stabilize your upper body by keeping your shoulders in contact with the floor.  Gently and slowly drop both knees together to one side until you feel a gentle stretch in your low back. Hold for __________ seconds.  Tense your stomach muscles to support your lower back as you bring your knees back to the starting position. Repeat the exercise to the other side. Repeat __________ times. Complete this exercise __________ times per day  EXTENSION RANGE OF MOTION AND FLEXIBILITY EXERCISES: STRETCH  Extension, Prone on Elbows   Lie on your stomach on the floor, a bed will be too soft. Place your palms about shoulder width apart and at the height of your head.  Place your elbows under your shoulders. If this is too painful, stack pillows under your chest.  Allow your body to relax so that your hips drop lower and make contact more completely with the floor.  Hold this position for __________ seconds.  Slowly return to lying flat on the floor. Repeat __________ times. Complete this exercise __________ times per day.  RANGE OF MOTION  Extension, Prone Press Ups  Lie on your stomach on the floor, a bed will be too soft. Place your palms about shoulder width apart and at the height of your head.  Keeping your back as relaxed as possible, slowly straighten your elbows while keeping your hips on the floor. You may adjust the placement of your hands to maximize your comfort. As you gain motion, your hands will come more underneath your shoulders.  Hold this position __________ seconds.  Slowly return to lying flat on the floor. Repeat __________ times. Complete this exercise __________ times per day.  RANGE OF MOTION- Quadruped, Neutral Spine   Assume a hands and knees position on a firm surface. Keep your hands under your shoulders and your knees under  your hips. You may place padding under your knees for comfort.  Drop your head and point your tailbone toward the ground below you. This will round out your lower back like an angry cat. Hold this position for __________ seconds.  Slowly lift your head and release your tail bone so that your back sags into a large arch, like an old horse.  Hold this position for __________ seconds.  Repeat this until you feel limber in your low back.  Now, find your "sweet spot." This will be the most comfortable position somewhere between the two previous positions. This is your neutral spine. Once you have found this position, tense your stomach muscles to support your low back.  Hold this position for __________ seconds. Repeat __________ times. Complete this exercise __________ times per day.  STRENGTHENING EXERCISES - Low Back Sprain These exercises may help you when beginning to rehabilitate your injury. These exercises should be done near your "sweet spot."  This is the neutral, low-back arch, somewhere between fully rounded and fully arched, that is your least painful position. When performed in this safe range of motion, these exercises can be used for people who have either a flexion or extension based injury. These exercises may resolve your symptoms with or without further involvement from your physician, physical therapist or athletic trainer. While completing these exercises, remember:   Muscles can gain both the endurance and the strength needed for everyday activities through controlled exercises.  Complete these exercises as instructed by your physician, physical therapist or athletic trainer. Increase the resistance and repetitions only as guided.  You may experience muscle soreness or fatigue, but the pain or discomfort you are trying to eliminate should never worsen during these exercises. If this pain does worsen, stop and make certain you are following the directions exactly. If the pain is  still present after adjustments, discontinue the exercise until you can discuss the trouble with your caregiver. STRENGTHENING Deep Abdominals, Pelvic Tilt   Lie on a firm bed or floor. Keeping your legs in front of you, bend your knees so they are both pointed toward the ceiling and your feet are flat on the floor.  Tense your lower abdominal muscles to press your low back into the floor. This motion will rotate your pelvis so that your tail bone is scooping upwards rather than pointing at your feet or into the floor. With a gentle tension and even breathing, hold this position for __________ seconds. Repeat __________ times. Complete this exercise __________ times per day.  STRENGTHENING  Abdominals, Crunches   Lie on a firm bed or floor. Keeping your legs in front of you, bend your knees so they are both pointed toward the ceiling and your feet are flat on the floor. Cross your arms over your chest.  Slightly tip your chin down without bending your neck.  Tense your abdominals and slowly lift your trunk high enough to just clear your shoulder blades. Lifting higher can put excessive stress on the lower back and does not further strengthen your abdominal muscles.  Control your return to the starting position. Repeat __________ times. Complete this exercise __________ times per day.  STRENGTHENING  Quadruped, Opposite UE/LE Lift   Assume a hands and knees position on a firm surface. Keep your hands under your shoulders and your knees under your hips. You may place padding under your knees for comfort.  Find your neutral spine and gently tense your abdominal muscles so that you can maintain this position. Your shoulders and hips should form a rectangle that is parallel with the floor and is not twisted.  Keeping your trunk steady, lift your right hand no higher than your shoulder and then your left leg no higher than your hip. Make sure you are not holding your breath. Hold this position for  __________ seconds.  Continuing to keep your abdominal muscles tense and your back steady, slowly return to your starting position. Repeat with the opposite arm and leg. Repeat __________ times. Complete this exercise __________ times per day.  STRENGTHENING  Abdominals and Quadriceps, Straight Leg Raise   Lie on a firm bed or floor with both legs extended in front of you.  Keeping one leg in contact with the floor, bend the other knee so that your foot can rest flat on the floor.  Find your neutral spine, and tense your abdominal muscles to maintain your spinal position throughout the exercise.  Slowly lift your straight leg  off the floor about 6 inches for a count of 15, making sure to not hold your breath.  Still keeping your neutral spine, slowly lower your leg all the way to the floor. Repeat this exercise with each leg __________ times. Complete this exercise __________ times per day. POSTURE AND BODY MECHANICS CONSIDERATIONS - Low Back Sprain Keeping correct posture when sitting, standing or completing your activities will reduce the stress put on different body tissues, allowing injured tissues a chance to heal and limiting painful experiences. The following are general guidelines for improved posture. Your physician or physical therapist will provide you with any instructions specific to your needs. While reading these guidelines, remember:  The exercises prescribed by your provider will help you have the flexibility and strength to maintain correct postures.  The correct posture provides the best environment for your joints to work. All of your joints have less wear and tear when properly supported by a spine with good posture. This means you will experience a healthier, less painful body.  Correct posture must be practiced with all of your activities, especially prolonged sitting and standing. Correct posture is as important when doing repetitive low-stress activities (typing) as it  is when doing a single heavy-load activity (lifting). RESTING POSITIONS Consider which positions are most painful for you when choosing a resting position. If you have pain with flexion-based activities (sitting, bending, stooping, squatting), choose a position that allows you to rest in a less flexed posture. You would want to avoid curling into a fetal position on your side. If your pain worsens with extension-based activities (prolonged standing, working overhead), avoid resting in an extended position such as sleeping on your stomach. Most people will find more comfort when they rest with their spine in a more neutral position, neither too rounded nor too arched. Lying on a non-sagging bed on your side with a pillow between your knees, or on your back with a pillow under your knees will often provide some relief. Keep in mind, being in any one position for a prolonged period of time, no matter how correct your posture, can still lead to stiffness. PROPER SITTING POSTURE In order to minimize stress and discomfort on your spine, you must sit with correct posture. Sitting with good posture should be effortless for a healthy body. Returning to good posture is a gradual process. Many people can work toward this most comfortably by using various supports until they have the flexibility and strength to maintain this posture on their own. When sitting with proper posture, your ears will fall over your shoulders and your shoulders will fall over your hips. You should use the back of the chair to support your upper back. Your lower back will be in a neutral position, just slightly arched. You may place a small pillow or folded towel at the base of your lower back for  support.  When working at a desk, create an environment that supports good, upright posture. Without extra support, muscles tire, which leads to excessive strain on joints and other tissues. Keep these recommendations in mind: CHAIR:  A chair  should be able to slide under your desk when your back makes contact with the back of the chair. This allows you to work closely.  The chair's height should allow your eyes to be level with the upper part of your monitor and your hands to be slightly lower than your elbows. BODY POSITION  Your feet should make contact with the floor. If this is  not possible, use a foot rest.  Keep your ears over your shoulders. This will reduce stress on your neck and low back. INCORRECT SITTING POSTURES  If you are feeling tired and unable to assume a healthy sitting posture, do not slouch or slump. This puts excessive strain on your back tissues, causing more damage and pain. Healthier options include:  Using more support, like a lumbar pillow.  Switching tasks to something that requires you to be upright or walking.  Talking a brief walk.  Lying down to rest in a neutral-spine position. PROLONGED STANDING WHILE SLIGHTLY LEANING FORWARD  When completing a task that requires you to lean forward while standing in one place for a long time, place either foot up on a stationary 2-4 inch high object to help maintain the best posture. When both feet are on the ground, the lower back tends to lose its slight inward curve. If this curve flattens (or becomes too large), then the back and your other joints will experience too much stress, tire more quickly, and can cause pain. CORRECT STANDING POSTURES Proper standing posture should be assumed with all daily activities, even if they only take a few moments, like when brushing your teeth. As in sitting, your ears should fall over your shoulders and your shoulders should fall over your hips. You should keep a slight tension in your abdominal muscles to brace your spine. Your tailbone should point down to the ground, not behind your body, resulting in an over-extended swayback posture.  INCORRECT STANDING POSTURES  Common incorrect standing postures include a forward  head, locked knees and/or an excessive swayback. WALKING Walk with an upright posture. Your ears, shoulders and hips should all line-up. PROLONGED ACTIVITY IN A FLEXED POSITION When completing a task that requires you to bend forward at your waist or lean over a low surface, try to find a way to stabilize 3 out of 4 of your limbs. You can place a hand or elbow on your thigh or rest a knee on the surface you are reaching across. This will provide you more stability, so that your muscles do not tire as quickly. By keeping your knees relaxed, or slightly bent, you will also reduce stress across your lower back. CORRECT LIFTING TECHNIQUES DO :  Assume a wide stance. This will provide you more stability and the opportunity to get as close as possible to the object which you are lifting.  Tense your abdominals to brace your spine. Bend at the knees and hips. Keeping your back locked in a neutral-spine position, lift using your leg muscles. Lift with your legs, keeping your back straight.  Test the weight of unknown objects before attempting to lift them.  Try to keep your elbows locked down at your sides in order get the best strength from your shoulders when carrying an object.  Always ask for help when lifting heavy or awkward objects. INCORRECT LIFTING TECHNIQUES DO NOT:   Lock your knees when lifting, even if it is a small object.  Bend and twist. Pivot at your feet or move your feet when needing to change directions.  Assume that you can safely pick up even a paperclip without proper posture. Document Released: 10/21/2005 Document Revised: 01/13/2012 Document Reviewed: 02/02/2009 [facility] Patient Information 2013 [place], [place].

## 2013-03-30 ENCOUNTER — Other Ambulatory Visit: Payer: Self-pay | Admitting: Physician Assistant

## 2013-04-10 ENCOUNTER — Other Ambulatory Visit: Payer: Self-pay | Admitting: Physician Assistant

## 2013-04-17 LAB — VITAMIN D 25 HYDROXY (VIT D DEFICIENCY, FRACTURES): Vit D, 25-Hydroxy: 32 ng/mL (ref 30–89)

## 2013-04-19 ENCOUNTER — Other Ambulatory Visit: Payer: Self-pay | Admitting: *Deleted

## 2013-05-26 ENCOUNTER — Other Ambulatory Visit: Payer: Self-pay | Admitting: Physician Assistant

## 2013-06-29 ENCOUNTER — Other Ambulatory Visit: Payer: Self-pay | Admitting: Physician Assistant

## 2013-08-04 ENCOUNTER — Other Ambulatory Visit: Payer: Self-pay | Admitting: Physician Assistant

## 2013-08-11 ENCOUNTER — Ambulatory Visit (INDEPENDENT_AMBULATORY_CARE_PROVIDER_SITE_OTHER): Payer: BC Managed Care – PPO | Admitting: Physician Assistant

## 2013-08-11 ENCOUNTER — Encounter: Payer: Self-pay | Admitting: Physician Assistant

## 2013-08-11 VITALS — BP 144/91 | HR 78 | Wt 201.0 lb

## 2013-08-11 DIAGNOSIS — R5381 Other malaise: Secondary | ICD-10-CM

## 2013-08-11 DIAGNOSIS — R079 Chest pain, unspecified: Secondary | ICD-10-CM

## 2013-08-11 DIAGNOSIS — R51 Headache: Secondary | ICD-10-CM

## 2013-08-11 DIAGNOSIS — R0789 Other chest pain: Secondary | ICD-10-CM

## 2013-08-11 DIAGNOSIS — H538 Other visual disturbances: Secondary | ICD-10-CM

## 2013-08-11 DIAGNOSIS — G479 Sleep disorder, unspecified: Secondary | ICD-10-CM | POA: Insufficient documentation

## 2013-08-11 DIAGNOSIS — R5383 Other fatigue: Secondary | ICD-10-CM | POA: Insufficient documentation

## 2013-08-11 DIAGNOSIS — E039 Hypothyroidism, unspecified: Secondary | ICD-10-CM

## 2013-08-11 LAB — TSH: TSH: 3.512 u[IU]/mL (ref 0.350–4.500)

## 2013-08-11 MED ORDER — NITROGLYCERIN 0.3 MG SL SUBL: 0.3000 mg | SUBLINGUAL_TABLET | SUBLINGUAL | Status: DC | PRN

## 2013-08-11 MED ORDER — LISINOPRIL 10 MG PO TABS: 10.0000 mg | ORAL_TABLET | Freq: Every day | ORAL | Status: DC

## 2013-08-11 NOTE — Progress Notes (Signed)
Subjective:    Patient ID: Tyrone Johnson, male    DOB: 03-05-1981, 32 y.o.   MRN: [id number]  HPI Patient is a 32 year old male who presents to the clinic to followup on hypothyroidism and fatigue. She's been taking his medications regularly however he does not feel much of an improvement with fatigue. When asked about sleeping he reports he has a lot of trouble going to sleep. Once he gets to sleep he can't stay asleep for the rest of the night. For work he does have to get up around 5 to 5:30. His been trying to get better on 9 but has been unsuccessful and tensor sleep around midnight. We have done a full workup of fatigue such as testosterone, CBC and thyroid. We have corrected his thyroid and should not be calls of fatigue. He denies any snoring. He wakes up feeling pretty good and progresses to feeling tired throughout the day. He has not tried anything to help him go to sleep or make things better.  Patient has also been having more frequent headaches. He reports that they are not every day but about 2 times a week. Sometimes his vision while evening gets very blurry. He drinks one cup of coffee in the morning and usually one bottle water all day long. He has not tried any medications to help headache. He does not like to take pills to help with pain. She denies any nausea or vomiting. The pain is at the back of his head but towards the top. He reports pain as a 4/10 pain level. Pain does not radiate. Patient's blood pressure is high today. He is also had chest pains but denies any palpitations.  Patient is also concerned because he's had a couple episodes of chest tightness and pain with exertion. The latest episode was last week while mowing the lawn he felt chest tightness. He did not stopping kept going and the tightness lasted for about a minute and went away. History nothing to make better and nothing makes worse. His experience episodes like this approximately 4 times over the past  month. We have checked his cholesterol and his LDL was 145.     Review of Systems     Objective:   Physical Exam  Constitutional: He is oriented to person, place, and time. He appears well-developed and well-nourished.  HENT:  Head: Normocephalic and atraumatic.  Cardiovascular: Normal rate, regular rhythm and normal heart sounds.   Pulmonary/Chest: Effort normal and breath sounds normal.  Neurological: He is alert and oriented to person, place, and time.  Skin: Skin is warm and dry.  Psychiatric: He has a normal mood and affect. His behavior is normal.          Assessment & Plan:  Hypothyroidism-we'll recheck thyroid level today and let patient know the results and send her for refills.  Headaches/chest pain/chest tightness with exertion-I would like to go ahead and start treating his blood pressure. I started on lisinopril 10 mg daily. Will recheck blood pressure in one to 2 months. I wonder if elevated blood pressure could be causing some of the headaches. I did get an EKG on patient today. Normal sinus rhythm at 78, no ST elevation or depression, no arrhythmias. No signs or symptoms of Q wave. I did go ahead and give him nitroglycerin tabs chest pain was with exertion he is able to take 1 tab sublingually and repeat after 5 minutes it continues. Patient aware that if these episodes continue I would  like to do a stress test to help evaluate if there is any blockages causing these periodic symptoms. I am not sure the role dehydration place with patient that since he works outside and not drinking enough water that could be some reason to think that dehydration is causing headache and even chest symptoms. Patient was instructed to drink 5-8 glasses of water a day. Vision was checked today and normal range.  Fatigue/sleeping difficulty-reassured patient that we have previously done a fairly significant workup for fatigue. I do not think his symptoms are consistent with sleep apnea. I  think patient needs help relax him at that time and would consider melatonin 3 mg 1 hour before bed. Instructed patient he could increase up to 10 mg. Discussed the good bedtime routine and activities in bed or in. Follow up in the next 2 months or as needed. Patient aware that 7-9 hours of sleep at night is needed to have the energy for the day.  Spent 30 minutes with patient and greater than 50% of encouraged counseling patient regarding chest pain and appropriate measures if continues.

## 2013-08-11 NOTE — Patient Instructions (Addendum)
Melatonin start 3mg  can increase to 10mg . Creating a good bedtime routine.   Call if chest tightness still occuring. Will get stress test.   Drink at least 3-5 bottles of water a day.   Start lisinopril and follow up in 2 months.

## 2013-08-12 ENCOUNTER — Telehealth: Payer: Self-pay | Admitting: *Deleted

## 2013-08-12 ENCOUNTER — Other Ambulatory Visit: Payer: Self-pay | Admitting: *Deleted

## 2013-08-12 DIAGNOSIS — R079 Chest pain, unspecified: Secondary | ICD-10-CM

## 2013-08-12 MED ORDER — LEVOTHYROXINE SODIUM 75 MCG PO TABS: ORAL_TABLET | ORAL | Status: DC

## 2013-10-11 ENCOUNTER — Ambulatory Visit: Payer: BC Managed Care – PPO | Admitting: Physician Assistant

## 2013-10-11 DIAGNOSIS — Z0289 Encounter for other administrative examinations: Secondary | ICD-10-CM

## 2014-02-25 ENCOUNTER — Ambulatory Visit (INDEPENDENT_AMBULATORY_CARE_PROVIDER_SITE_OTHER): Payer: BC Managed Care – PPO | Admitting: Physician Assistant

## 2014-02-25 ENCOUNTER — Encounter: Payer: Self-pay | Admitting: Physician Assistant

## 2014-02-25 VITALS — BP 113/72 | HR 58 | Ht 66.0 in | Wt 199.0 lb

## 2014-02-25 DIAGNOSIS — Z0000 Encounter for general adult medical examination without abnormal findings: Secondary | ICD-10-CM

## 2014-02-25 DIAGNOSIS — Z131 Encounter for screening for diabetes mellitus: Secondary | ICD-10-CM

## 2014-02-25 DIAGNOSIS — Z79899 Other long term (current) drug therapy: Secondary | ICD-10-CM

## 2014-02-25 DIAGNOSIS — E785 Hyperlipidemia, unspecified: Secondary | ICD-10-CM

## 2014-02-25 DIAGNOSIS — E039 Hypothyroidism, unspecified: Secondary | ICD-10-CM

## 2014-02-25 LAB — COMPLETE METABOLIC PANEL WITH GFR
ALT: 87 U/L — ABNORMAL HIGH (ref 0–53)
AST: 29 U/L (ref 0–37)
Albumin: 4.5 g/dL (ref 3.5–5.2)
Alkaline Phosphatase: 100 U/L (ref 39–117)
BUN: 14 mg/dL (ref 6–23)
CALCIUM: 9.1 mg/dL (ref 8.4–10.5)
CHLORIDE: 105 meq/L (ref 96–112)
CO2: 27 mEq/L (ref 19–32)
CREATININE: 0.87 mg/dL (ref 0.50–1.35)
GFR, Est African American: >89 mL/min
GLUCOSE: 90 mg/dL (ref 70–99)
Potassium: 4.3 mEq/L (ref 3.5–5.3)
Sodium: 140 mEq/L (ref 135–145)
Total Bilirubin: 0.8 mg/dL (ref 0.2–1.2)
Total Protein: 7.3 g/dL (ref 6.0–8.3)

## 2014-02-25 LAB — LIPID PANEL
CHOL/HDL RATIO: 4.6 ratio
CHOLESTEROL: 207 mg/dL — AB (ref 0–200)
HDL: 45 mg/dL (ref >39)
LDL Cholesterol: 136 mg/dL — ABNORMAL HIGH (ref 0–99)
TRIGLYCERIDES: 131 mg/dL (ref <150)
VLDL: 26 mg/dL (ref 0–40)

## 2014-02-25 NOTE — Patient Instructions (Signed)

## 2014-02-25 NOTE — Progress Notes (Signed)
   Subjective:    Patient ID: Tyrone Johnson, male    DOB: [birth year], 33 y.o.   MRN: [id number]  HPI Patient is a 33 year old male who presents to the clinic for complete physical. He has no complaints or concerns today. He does have hyperthyroidism and he has not had his labs checked in a while. He is taking Synthroid 34mcg daily. He did have elevated LDL last complete physical. She is exercising regularly. He is lifting weights in has made any changes to his diet. At last physical he complained of chest pain. That has resolved with eliminating greasy foods out of diet.   Review of Systems  All other systems reviewed and are negative.      Objective:   Physical Exam BP 113/72  Pulse 58  Ht 5\' 6"  (1.676 m)  Wt 199 lb (90.266 kg)  BMI 32.13 kg/m2  General Appearance:    Alert, cooperative, no distress, appears stated age  Head:    Normocephalic, without obvious abnormality, atraumatic  Eyes:    PERRL, conjunctiva/corneas clear, EOM's intact, fundi    benign, both eyes       Ears:    Normal TM's and external ear canals, both ears  Nose:   Nares normal, septum midline, mucosa normal, no drainage    or sinus tenderness  Throat:   Lips, mucosa, and tongue normal; teeth and gums normal  Neck:   Supple, symmetrical, trachea midline, no adenopathy;       thyroid:  No enlargement/tenderness/nodules; no carotid   bruit or JVD  Back:     Symmetric, no curvature, ROM normal, no CVA tenderness  Lungs:     Clear to auscultation bilaterally, respirations unlabored  Chest wall:    No tenderness or deformity  Heart:    Regular rate and rhythm, S1 and S2 normal, no murmur, rub   or gallop  Abdomen:     Soft, non-tender, bowel sounds active all four quadrants,    no masses, no organomegaly  Genitalia:    Not done.   Rectal:    Not done.   Extremities:   Extremities normal, atraumatic, no cyanosis or edema  Pulses:   2+ and symmetric all extremities  Skin:   Skin color, texture, turgor  normal, no rashes or lesions  Lymph nodes:   Cervical, supraclavicular, and axillary nodes normal  Neurologic:   CNII-XII intact. Normal strength, sensation and reflexes      throughout         Assessment & Plan:  CPE- fasting lab slip was given for patient to have drawn. Vaccine UPT today. Encouraged patient to continue working on weight loss. Followup in one year. Gave handout for health maintenance items to consider.  Hypothyroidism-we'll recheck TSH today. We'll refill accordingly.

## 2014-02-26 LAB — TSH: TSH: 1.141 u[IU]/mL (ref 0.350–4.500)

## 2014-02-28 ENCOUNTER — Other Ambulatory Visit: Payer: Self-pay | Admitting: Physician Assistant

## 2014-02-28 MED ORDER — LEVOTHYROXINE SODIUM 75 MCG PO TABS: ORAL_TABLET | ORAL | Status: DC

## 2014-04-13 ENCOUNTER — Other Ambulatory Visit: Payer: Self-pay | Admitting: Physician Assistant

## 2014-04-13 ENCOUNTER — Ambulatory Visit (INDEPENDENT_AMBULATORY_CARE_PROVIDER_SITE_OTHER): Payer: BC Managed Care – PPO | Admitting: Physician Assistant

## 2014-04-13 ENCOUNTER — Encounter: Payer: Self-pay | Admitting: Physician Assistant

## 2014-04-13 VITALS — BP 133/82 | HR 85 | Wt 203.0 lb

## 2014-04-13 DIAGNOSIS — D239 Other benign neoplasm of skin, unspecified: Secondary | ICD-10-CM

## 2014-04-13 DIAGNOSIS — D229 Melanocytic nevi, unspecified: Secondary | ICD-10-CM

## 2014-04-13 NOTE — Patient Instructions (Signed)
Biopsy °Care After °Refer to this sheet in the next few weeks. These instructions provide you with information on caring for yourself after your procedure. Your caregiver may also give you more specific instructions. Your treatment has been planned according to current medical practices, but problems sometimes occur. Call your caregiver if you have any problems or questions after your procedure. °If you had a fine needle biopsy, you may have soreness at the biopsy site for 1 to 2 days. If you had an open biopsy, you may have soreness at the biopsy site for 3 to 4 days. °HOME CARE INSTRUCTIONS  °· You may resume normal diet and activities as directed. °· Change bandages (dressings) as directed. If your wound was closed with a skin glue (adhesive), it will wear off and begin to peel in 7 days. °· Only take over-the-counter or prescription medicines for pain, discomfort, or fever as directed by your caregiver. °· Ask your caregiver when you can bathe and get your wound wet. °SEEK IMMEDIATE MEDICAL CARE IF:  °· You have increased bleeding (more than a small spot) from the biopsy site. °· You notice redness, swelling, or increasing pain at the biopsy site. °· You have pus coming from the biopsy site. °· You have a fever. °· You notice a bad smell coming from the biopsy site or dressing. °· You have a rash, have difficulty breathing, or have any allergic problems. °MAKE SURE YOU:  °· Understand these instructions. °· Will watch your condition. °· Will get help right away if you are not doing well or get worse. °Document Released: 05/10/2005 Document Revised: 01/13/2012 Document Reviewed: 04/18/2011 °[employer]® Patient Information ©2014 [employer]. ° °

## 2014-04-15 NOTE — Progress Notes (Signed)
   Subjective:    Patient ID: Tyrone Johnson, male    DOB: 22-Nov-1980, 33 y.o.   MRN: [id number]  HPI Patient presents to the clinic to have to atypical nevi removed of his chest. He works out in the sun all day and has a lot of sun exposure. He has notice this particular nevus growing lately.    Review of Systems  All other systems reviewed and are negative.      Objective:   Physical Exam  Constitutional: He appears well-developed and well-nourished.  Pulmonary/Chest:    Psychiatric: He has a normal mood and affect. His behavior is normal.          Assessment & Plan:  Atypical nevus-   Shave Biopsy Procedure Note  Pre-operative Diagnosis: Suspicious lesion  Post-operative Diagnosis: same  Locations: right and left upper chest  Indications: hyperpigemented/changing/sun exposure  Anesthesia: Lidocaine 1% without epinephrine   Procedure Details  History of allergy to iodine: no  Patient informed of the risks (including bleeding and infection) and benefits of the  procedure and Verbal informed consent obtained.  The lesion and surrounding area were given a sterile prep using betadyne and draped in the usual sterile fashion. A scalpel was used to shave an area of skin approximately 1cm by 1cm.  Hemostasis achieved with alumuninum chloride. Antibiotic ointment and a sterile dressing applied.  The specimen was sent for pathologic examination. The patient tolerated the procedure well.  EBL: scant  Findings: macule  Condition: Stable  Complications: none.  Plan: 1. Instructed to keep the wound dry and covered for 24-48h and clean thereafter. 2. Warning signs of infection were reviewed.   3. Recommended that the patient use OTC acetaminophen as needed for pain.

## 2014-04-18 ENCOUNTER — Other Ambulatory Visit: Payer: Self-pay | Admitting: Physician Assistant

## 2014-04-18 DIAGNOSIS — D229 Melanocytic nevi, unspecified: Secondary | ICD-10-CM

## 2014-06-10 ENCOUNTER — Encounter: Payer: Self-pay | Admitting: Physician Assistant

## 2014-06-10 ENCOUNTER — Ambulatory Visit (INDEPENDENT_AMBULATORY_CARE_PROVIDER_SITE_OTHER): Payer: BC Managed Care – PPO | Admitting: Physician Assistant

## 2014-06-10 VITALS — BP 108/66 | HR 57 | Ht 66.0 in | Wt 191.0 lb

## 2014-06-10 DIAGNOSIS — M2550 Pain in unspecified joint: Secondary | ICD-10-CM

## 2014-06-10 DIAGNOSIS — R5381 Other malaise: Secondary | ICD-10-CM

## 2014-06-10 DIAGNOSIS — R5383 Other fatigue: Secondary | ICD-10-CM

## 2014-06-10 DIAGNOSIS — R52 Pain, unspecified: Secondary | ICD-10-CM | POA: Diagnosis not present

## 2014-06-10 LAB — CBC WITH DIFFERENTIAL/PLATELET
BASOS PCT: 1 % (ref 0–1)
Basophils Absolute: 0 10*3/uL (ref 0.0–0.1)
EOS ABS: 0.1 10*3/uL (ref 0.0–0.7)
Eosinophils Relative: 3 % (ref 0–5)
HCT: 41.9 % (ref 39.0–52.0)
HEMOGLOBIN: 14.8 g/dL (ref 13.0–17.0)
LYMPHS ABS: 1.8 10*3/uL (ref 0.7–4.0)
Lymphocytes Relative: 44 % (ref 12–46)
MCH: 31.8 pg (ref 26.0–34.0)
MCHC: 35.3 g/dL (ref 30.0–36.0)
MCV: 89.9 fL (ref 78.0–100.0)
Monocytes Absolute: 0.3 10*3/uL (ref 0.1–1.0)
Monocytes Relative: 7 % (ref 3–12)
NEUTROS PCT: 45 % (ref 43–77)
Neutro Abs: 1.8 10*3/uL (ref 1.7–7.7)
Platelets: 230 10*3/uL (ref 150–400)
RBC: 4.66 MIL/uL (ref 4.22–5.81)
RDW: 13.9 % (ref 11.5–15.5)
WBC: 4 10*3/uL (ref 4.0–10.5)

## 2014-06-10 NOTE — Patient Instructions (Signed)
Go ahead and get a B12 supplement 1094mcg daily.

## 2014-06-10 NOTE — Progress Notes (Signed)
   Subjective:    Patient ID: Tyrone Johnson, male    DOB: 09-15-1981, 33 y.o.   MRN: [id number]  HPI Pt presents to the clinic with one and 1/2 months of generalized fatigue and feeling achy all over. Some days shoulders and some days knees and some days ankles. No fever, chills, cough, wheezing, SOB. He reports that he is sleeping from 10-5:30 every night and denies any snoring. Denies any depression and anxiety. infact good things are happening at work. caffiene helps the most. He does have hypothyroidism but was check 3 months ago and looked great.     Review of Systems  All other systems reviewed and are negative.      Objective:   Physical Exam  Constitutional: He is oriented to person, place, and time. He appears well-developed and well-nourished.  HENT:  Head: Normocephalic and atraumatic.  Right Ear: External ear normal.  Left Ear: External ear normal.  Nose: Nose normal.  Mouth/Throat: Oropharynx is clear and moist.  Eyes: Conjunctivae are normal. Right eye exhibits no discharge. Left eye exhibits no discharge.  Neck: Normal range of motion. Neck supple.  Cardiovascular: Normal rate, regular rhythm and normal heart sounds.   Pulmonary/Chest: Effort normal and breath sounds normal. He has no wheezes.  Abdominal: Soft. Bowel sounds are normal. He exhibits no distension and no mass. There is no tenderness. There is no guarding.  Musculoskeletal:  Strength of upper and lower extremities 5/5.    Lymphadenopathy:    He has no cervical adenopathy.  Neurological: He is alert and oriented to person, place, and time. He has normal reflexes.  Skin: Skin is dry.  Psychiatric: He has a normal mood and affect. His behavior is normal.          Assessment & Plan:  Fatigue/arthragia/body aches- reassured no infectious findings. Will get labs to evaluate vitamins, hemoglobin, and recheck thyroid. In meantime suggested a b12 supplement to start. He is sleeping a fair amouth at  night. Depression screening was negative. Unclear etiology at this point.

## 2014-06-11 LAB — COMPLETE METABOLIC PANEL WITH GFR
ALT: 61 U/L — ABNORMAL HIGH (ref 0–53)
AST: 32 U/L (ref 0–37)
Albumin: 4.9 g/dL (ref 3.5–5.2)
Alkaline Phosphatase: 86 U/L (ref 39–117)
BILIRUBIN TOTAL: 0.8 mg/dL (ref 0.2–1.2)
BUN: 15 mg/dL (ref 6–23)
CO2: 24 mEq/L (ref 19–32)
CREATININE: 0.91 mg/dL (ref 0.50–1.35)
Calcium: 9.5 mg/dL (ref 8.4–10.5)
Chloride: 104 mEq/L (ref 96–112)
GFR, Est African American: >89 mL/min
GFR, Est Non African American: >89 mL/min
Glucose, Bld: 88 mg/dL (ref 70–99)
Potassium: 4.1 mEq/L (ref 3.5–5.3)
Sodium: 142 mEq/L (ref 135–145)
Total Protein: 7.1 g/dL (ref 6.0–8.3)

## 2014-06-11 LAB — FERRITIN: FERRITIN: 308 ng/mL (ref 22–322)

## 2014-06-11 LAB — T4, FREE: Free T4: 1.12 ng/dL (ref 0.80–1.80)

## 2014-06-11 LAB — TSH: TSH: 2.356 u[IU]/mL (ref 0.350–4.500)

## 2014-06-11 LAB — VITAMIN D 25 HYDROXY (VIT D DEFICIENCY, FRACTURES): VIT D 25 HYDROXY: 31 ng/mL (ref 30–89)

## 2014-06-11 LAB — VITAMIN B12: Vitamin B-12: 601 pg/mL (ref 211–911)

## 2014-10-11 ENCOUNTER — Other Ambulatory Visit: Payer: Self-pay | Admitting: Physician Assistant

## 2014-11-23 ENCOUNTER — Other Ambulatory Visit: Payer: Self-pay | Admitting: Physician Assistant

## 2015-01-04 ENCOUNTER — Other Ambulatory Visit: Payer: Self-pay | Admitting: Physician Assistant

## 2015-01-04 NOTE — Telephone Encounter (Signed)
Due for f/u labs. Last labs 8/15

## 2015-01-09 ENCOUNTER — Other Ambulatory Visit: Payer: Self-pay | Admitting: Physician Assistant

## 2015-01-18 ENCOUNTER — Ambulatory Visit (INDEPENDENT_AMBULATORY_CARE_PROVIDER_SITE_OTHER): Payer: BLUE CROSS/BLUE SHIELD | Admitting: Physician Assistant

## 2015-01-18 ENCOUNTER — Encounter: Payer: Self-pay | Admitting: Physician Assistant

## 2015-01-18 VITALS — BP 120/78 | HR 60 | Ht 66.0 in | Wt 186.0 lb

## 2015-01-18 DIAGNOSIS — E039 Hypothyroidism, unspecified: Secondary | ICD-10-CM

## 2015-01-18 MED ORDER — LEVOTHYROXINE SODIUM 75 MCG PO TABS: 75.0000 ug | ORAL_TABLET | Freq: Every day | ORAL | Status: DC

## 2015-01-18 NOTE — Progress Notes (Signed)
   Subjective:    Patient ID: Tyrone Johnson, male    DOB: [birth year], 34 y.o.   MRN: [id number]  HPI Pt presents to the clinic for hypothyroidism refill. He has been out of medication for 3 weeks. Before being out of medication he felt great. No other concerns or complaints.    Review of Systems  All other systems reviewed and are negative.      Objective:   Physical Exam  Constitutional: He is oriented to person, place, and time. He appears well-developed and well-nourished.  HENT:  Head: Normocephalic and atraumatic.  Neck: Normal range of motion. Neck supple. No thyromegaly present.  Cardiovascular: Normal rate, regular rhythm and normal heart sounds.   Pulmonary/Chest: Effort normal and breath sounds normal.  Neurological: He is alert and oriented to person, place, and time.  Skin: Skin is dry.  Psychiatric: He has a normal mood and affect. His behavior is normal.          Assessment & Plan:  Hypothyroidism- ordered TSH- pt needs to wait until restarts levothyroxine for 6 weeks then have checked. If in normal range ok refills for 1 year.

## 2015-02-27 ENCOUNTER — Encounter: Payer: Self-pay | Admitting: Physician Assistant

## 2015-02-27 ENCOUNTER — Ambulatory Visit (INDEPENDENT_AMBULATORY_CARE_PROVIDER_SITE_OTHER): Payer: BLUE CROSS/BLUE SHIELD | Admitting: Physician Assistant

## 2015-02-27 VITALS — BP 129/86 | HR 80 | Ht 66.0 in | Wt 182.0 lb

## 2015-02-27 DIAGNOSIS — E039 Hypothyroidism, unspecified: Secondary | ICD-10-CM

## 2015-02-27 DIAGNOSIS — Z131 Encounter for screening for diabetes mellitus: Secondary | ICD-10-CM | POA: Diagnosis not present

## 2015-02-27 DIAGNOSIS — E785 Hyperlipidemia, unspecified: Secondary | ICD-10-CM

## 2015-02-27 DIAGNOSIS — Z0000 Encounter for general adult medical examination without abnormal findings: Secondary | ICD-10-CM | POA: Diagnosis not present

## 2015-02-27 NOTE — Patient Instructions (Signed)

## 2015-02-27 NOTE — Progress Notes (Signed)
   Subjective:    Patient ID: Tyrone Johnson, male    DOB: 01/26/1981, 34 y.o.   MRN: [id number]  HPI Pt presents to the clinic for CPE. No problems or concerns. Not yet checked TSH since restarting medication.   . Family History  Problem Relation Age of Onset  . Diabetes Mother   . Ulcers Father   . Eczema Son    . History   Social History  . Marital Status: Married    Spouse Name: N/A  . Number of Children: N/A  . Years of Education: N/A   Occupational History  . Not on file.   Social History Main Topics  . Smoking status: Former Research scientist (life sciences)  . Smokeless tobacco: Never Used  . Alcohol Use: Not on file  . Drug Use: No  . Sexual Activity: Yes    Birth Control/ Protection: Condom, Rhythm   Other Topics Concern  . Not on file   Social History Narrative      Review of Systems  All other systems reviewed and are negative.      Objective:   Physical Exam BP 129/86 mmHg  Pulse 80  Ht 5\' 6"  (1.676 m)  Wt 182 lb (82.555 kg)  BMI 29.39 kg/m2  General Appearance:    Alert, cooperative, no distress, appears stated age  Head:    Normocephalic, without obvious abnormality, atraumatic  Eyes:    PERRL, conjunctiva/corneas clear, EOM's intact, fundi    benign, both eyes       Ears:    Normal TM's and external ear canals, both ears  Nose:   Nares normal, septum midline, mucosa normal, no drainage    or sinus tenderness  Throat:   Lips, mucosa, and tongue normal; teeth and gums normal  Neck:   Supple, symmetrical, trachea midline, no adenopathy;       thyroid:  No enlargement/tenderness/nodules; no carotid   bruit or JVD  Back:     Symmetric, no curvature, ROM normal, no CVA tenderness  Lungs:     Clear to auscultation bilaterally, respirations unlabored  Chest wall:    No tenderness or deformity  Heart:    Regular rate and rhythm, S1 and S2 normal, no murmur, rub   or gallop  Abdomen:     Soft, non-tender, bowel sounds active all four quadrants,    no masses, no  organomegaly        Extremities:   Extremities normal, atraumatic, no cyanosis or edema  Pulses:   2+ and symmetric all extremities  Skin:   Skin color, texture, turgor normal, no rashes or lesions  Lymph nodes:   Cervical, supraclavicular, and axillary nodes normal  Neurologic:   CNII-XII intact. Normal strength, sensation and reflexes      throughout         Assessment & Plan:  CPE- vaccines up to date. No urinary or prostate symptoms. Ordered lipid and cmp. Discussed mens daily multivitamin.   Hypothyroidism- given lab slip to have checked and will refill accordingly.

## 2015-03-24 LAB — COMPLETE METABOLIC PANEL WITH GFR
ALK PHOS: 88 U/L (ref 39–117)
ALT: 27 U/L (ref 0–53)
AST: 13 U/L (ref 0–37)
Albumin: 4.3 g/dL (ref 3.5–5.2)
BILIRUBIN TOTAL: 0.7 mg/dL (ref 0.2–1.2)
BUN: 12 mg/dL (ref 6–23)
CHLORIDE: 106 meq/L (ref 96–112)
CO2: 30 mEq/L (ref 19–32)
Calcium: 9.2 mg/dL (ref 8.4–10.5)
Creat: 0.86 mg/dL (ref 0.50–1.35)
GFR, Est African American: >89 mL/min
Glucose, Bld: 88 mg/dL (ref 70–99)
POTASSIUM: 4.5 meq/L (ref 3.5–5.3)
Sodium: 141 mEq/L (ref 135–145)
Total Protein: 7.2 g/dL (ref 6.0–8.3)

## 2015-03-24 LAB — LIPID PANEL
CHOLESTEROL: 178 mg/dL (ref 0–200)
HDL: 44 mg/dL (ref >=40)
LDL Cholesterol: 107 mg/dL — ABNORMAL HIGH (ref 0–99)
TRIGLYCERIDES: 135 mg/dL (ref <150)
Total CHOL/HDL Ratio: 4 Ratio
VLDL: 27 mg/dL (ref 0–40)

## 2015-03-24 LAB — HIV ANTIBODY (ROUTINE TESTING W REFLEX): HIV 1&2 Ab, 4th Generation: NONREACTIVE

## 2015-03-24 LAB — TSH: TSH: 2.149 u[IU]/mL (ref 0.350–4.500)

## 2015-07-10 ENCOUNTER — Other Ambulatory Visit: Payer: Self-pay | Admitting: Physician Assistant

## 2015-07-12 ENCOUNTER — Other Ambulatory Visit: Payer: Self-pay

## 2015-07-12 MED ORDER — LEVOTHYROXINE SODIUM 75 MCG PO TABS: 75.0000 ug | ORAL_TABLET | Freq: Every day | ORAL | Status: DC

## 2015-10-25 ENCOUNTER — Encounter: Payer: Self-pay | Admitting: Physician Assistant

## 2015-10-25 ENCOUNTER — Ambulatory Visit (INDEPENDENT_AMBULATORY_CARE_PROVIDER_SITE_OTHER): Payer: BLUE CROSS/BLUE SHIELD | Admitting: Physician Assistant

## 2015-10-25 VITALS — BP 140/69 | HR 72 | Temp 98.1°F | Ht 66.0 in | Wt 200.0 lb

## 2015-10-25 DIAGNOSIS — J0100 Acute maxillary sinusitis, unspecified: Secondary | ICD-10-CM

## 2015-10-25 MED ORDER — AMOXICILLIN-POT CLAVULANATE 875-125 MG PO TABS: 1.0000 | ORAL_TABLET | Freq: Two times a day (BID) | ORAL | Status: DC

## 2015-10-25 NOTE — Patient Instructions (Signed)

## 2015-10-25 NOTE — Progress Notes (Signed)
   Subjective:    Patient ID: Tyrone Johnson, male    DOB: Sep 08, 1981, 34 y.o.   MRN: [id number]  HPI Pt is a 34 yo male who presents to the clinic with 2 weeks of sinus pressure, left ear congestion and pain. He has a cough that is productive in the morning but better throughout the day. No SOB or wheezing. No fever. He has a ongoing headache. He has tried dayquil, nyquil, mucinex, delsym with little relief. No other sick contacts. No body aches or chills.    Review of Systems See hpi.     Objective:   Physical Exam  Constitutional: He is oriented to person, place, and time. He appears well-developed and well-nourished.  HENT:  Head: Normocephalic and atraumatic.  Bilateral TM's erythematous. +light reflex.   Maxillary and frontal sinus tenderness to palpation.   Oropharynx erythematous without tonsilar swelling or exudate.   Nasal turbinates red and swollen.   Eyes: Conjunctivae are normal. Right eye exhibits no discharge. Left eye exhibits no discharge.  Neck: Normal range of motion. Neck supple.  Cardiovascular: Normal rate, regular rhythm and normal heart sounds.   Pulmonary/Chest: Effort normal and breath sounds normal. He has no wheezes.  Lymphadenopathy:    He has no cervical adenopathy.  Neurological: He is alert and oriented to person, place, and time.  Psychiatric: He has a normal mood and affect. His behavior is normal.          Assessment & Plan:  Sinusitis- treated with augmentin for 10 days. Discussed flonase as needed. Symptomatic care given. Follow up if not improving or if worsening.

## 2016-02-19 ENCOUNTER — Encounter: Payer: BLUE CROSS/BLUE SHIELD | Admitting: Physician Assistant

## 2016-02-28 ENCOUNTER — Encounter: Payer: Self-pay | Admitting: Physician Assistant

## 2016-02-28 ENCOUNTER — Ambulatory Visit (INDEPENDENT_AMBULATORY_CARE_PROVIDER_SITE_OTHER): Payer: BLUE CROSS/BLUE SHIELD | Admitting: Physician Assistant

## 2016-02-28 VITALS — BP 133/84 | HR 82 | Ht 66.0 in | Wt 205.0 lb

## 2016-02-28 DIAGNOSIS — R519 Headache, unspecified: Secondary | ICD-10-CM

## 2016-02-28 DIAGNOSIS — R51 Headache: Secondary | ICD-10-CM

## 2016-02-28 DIAGNOSIS — Z0000 Encounter for general adult medical examination without abnormal findings: Secondary | ICD-10-CM

## 2016-02-28 DIAGNOSIS — J320 Chronic maxillary sinusitis: Secondary | ICD-10-CM | POA: Insufficient documentation

## 2016-02-28 DIAGNOSIS — E039 Hypothyroidism, unspecified: Secondary | ICD-10-CM | POA: Diagnosis not present

## 2016-02-28 DIAGNOSIS — E7800 Pure hypercholesterolemia, unspecified: Secondary | ICD-10-CM | POA: Diagnosis not present

## 2016-02-28 DIAGNOSIS — Z131 Encounter for screening for diabetes mellitus: Secondary | ICD-10-CM | POA: Insufficient documentation

## 2016-02-28 MED ORDER — CETIRIZINE HCL 10 MG PO TABS: 10.0000 mg | ORAL_TABLET | Freq: Every day | ORAL | Status: DC

## 2016-02-28 MED ORDER — FLUTICASONE PROPIONATE 50 MCG/ACT NA SUSP: 2.0000 | Freq: Every day | NASAL | Status: DC

## 2016-02-28 MED ORDER — AMOXICILLIN-POT CLAVULANATE 875-125 MG PO TABS: 1.0000 | ORAL_TABLET | Freq: Two times a day (BID) | ORAL | Status: DC

## 2016-02-28 NOTE — Patient Instructions (Signed)

## 2016-02-28 NOTE — Progress Notes (Signed)
Subjective:    Patient ID: Tyrone Johnson, male    DOB: November 09, 1980, 35 y.o.   MRN: [id number]  HPI  Pt is a 35 yo male who presents to the clinic for CPE. He would also like to discuss new onset right sided headaches for the last 1-1/2 months. Start in am and gradually get worse with peak symptoms about 5pm. Ibuprofen helps some but does not go away. He wakes up next morning with same headache. He does feel a little dizzy and nauseated but no vomiting. No vision changes. Does not wear glasses. No extremity weakness. He does have a hx of allergies. He works out side and admits allergies have been bad. Not taking anything.   Hypothyroidism- taking medication daily. Needs refills.   .. Active Ambulatory Problems    Diagnosis Date Noted  . UNSPECIFIED HYPOTHYROIDISM 05/30/2008  . DEPRESSION 06/27/2008  . ACUTE MAXILLARY SINUSITIS 11/01/2009  . CONSTIPATION 11/06/2009  . FATTY LIVER DISEASE 04/28/2009  . SACROILIITIS, RIGHT 08/01/2008  . OTHER NONSPECIFIC ABNORMAL SERUM ENZYME LEVELS 04/24/2009  . Sleeping difficulty 08/11/2013  . Fatigue 08/11/2013  . Chronic right maxillary sinusitis 02/28/2016   Resolved Ambulatory Problems    Diagnosis Date Noted  . No Resolved Ambulatory Problems   Past Medical History  Diagnosis Date  . Thyroid disease   . Myalgia    . Family History  Problem Relation Age of Onset  . Diabetes Mother   . Ulcers Father   . Eczema Son        Review of Systems  All other systems reviewed and are negative.      Objective:   Physical Exam  BP 133/84 mmHg  Pulse 82  Ht 5\' 6"  (1.676 m)  Wt 205 lb (92.987 kg)  BMI 33.10 kg/m2  General Appearance:    Alert, cooperative, no distress, appears stated age  Head:    Normocephalic, without obvious abnormality, atraumatic  Eyes:    PERRL, conjunctiva/corneas clear, EOM's intact, fundi    benign, both eyes       Ears:    Normal TM's and external ear canals, both ears  Nose:   Nares normal, septum  midline, mucosa normal, no drainage    or sinus tenderness  Throat:   Lips, mucosa, and tongue normal; teeth and gums normal  Neck:   Supple, symmetrical, trachea midline, no adenopathy;       thyroid:  No enlargement/tenderness/nodules; no carotid   bruit or JVD  Back:     Symmetric, no curvature, ROM normal, no CVA tenderness  Lungs:     Clear to auscultation bilaterally, respirations unlabored  Chest wall:    No tenderness or deformity  Heart:    Regular rate and rhythm, S1 and S2 normal, no murmur, rub   or gallop  Abdomen:     Soft, non-tender, bowel sounds active all four quadrants,    no masses, no organomegaly  Genitalia:   not done.   Rectal:  Not done.   Extremities:   Extremities normal, atraumatic, no cyanosis or edema  Pulses:   2+ and symmetric all extremities  Skin:   Skin color, texture, turgor normal, no rashes or lesions  Lymph nodes:   Cervical, supraclavicular, and axillary nodes normal  Neurologic:   CNII-XII intact. Normal strength, sensation and reflexes      throughout        Assessment & Plan:  CPE- fasting lipid and cmp ordered. AUA-0. Encouraged exercise and healthy diet.  Hypothyroidism- recheck TSH and adjust medication accordingly.   Right sided headaches/chronic maxillary sinusitis- treated with augmentin for 10 days. Continue with flonase 2 sprays each nostril and zyrtec 10mg  daily. Unclear etiology suspect headaches from allergic sinusitis. If headaches not improving in next month follow up.

## 2016-02-29 LAB — COMPLETE METABOLIC PANEL WITH GFR
ALBUMIN: 5 g/dL (ref 3.6–5.1)
ALK PHOS: 89 U/L (ref 40–115)
ALT: 119 U/L — ABNORMAL HIGH (ref 9–46)
AST: 38 U/L (ref 10–40)
BUN: 9 mg/dL (ref 7–25)
CHLORIDE: 101 mmol/L (ref 98–110)
CO2: 26 mmol/L (ref 20–31)
Calcium: 9.7 mg/dL (ref 8.6–10.3)
Creat: 0.85 mg/dL (ref 0.60–1.35)
GFR, Est African American: >89 mL/min (ref >=60)
GFR, Est Non African American: >89 mL/min (ref >=60)
GLUCOSE: 104 mg/dL — AB (ref 65–99)
POTASSIUM: 4 mmol/L (ref 3.5–5.3)
SODIUM: 140 mmol/L (ref 135–146)
Total Bilirubin: 0.6 mg/dL (ref 0.2–1.2)
Total Protein: 7.7 g/dL (ref 6.1–8.1)

## 2016-02-29 LAB — LIPID PANEL
CHOL/HDL RATIO: 4.6 ratio (ref <=5.0)
Cholesterol: 219 mg/dL — ABNORMAL HIGH (ref 125–200)
HDL: 48 mg/dL (ref >=40)
LDL CALC: 132 mg/dL — AB (ref <130)
Triglycerides: 193 mg/dL — ABNORMAL HIGH (ref <150)
VLDL: 39 mg/dL — AB (ref <30)

## 2016-02-29 LAB — TSH: TSH: 4.45 mIU/L (ref 0.40–4.50)

## 2016-03-05 ENCOUNTER — Ambulatory Visit (INDEPENDENT_AMBULATORY_CARE_PROVIDER_SITE_OTHER): Payer: BLUE CROSS/BLUE SHIELD | Admitting: Physician Assistant

## 2016-03-05 VITALS — BP 133/67 | HR 64

## 2016-03-05 DIAGNOSIS — R739 Hyperglycemia, unspecified: Secondary | ICD-10-CM

## 2016-03-05 DIAGNOSIS — R7309 Other abnormal glucose: Secondary | ICD-10-CM

## 2016-03-05 LAB — POCT GLYCOSYLATED HEMOGLOBIN (HGB A1C): HEMOGLOBIN A1C: 5.4

## 2016-03-05 NOTE — Progress Notes (Signed)
Patient came into clinic today for POC A1C check based on latest blood work. Pt was given a printed copy of his labs and went into great detail on his numbers and recommendations from PCP. No further questions.   a1C is great. No signs of diabetes. Iran Planas PA-C

## 2016-07-31 ENCOUNTER — Other Ambulatory Visit: Payer: Self-pay | Admitting: Physician Assistant

## 2016-08-05 ENCOUNTER — Ambulatory Visit (INDEPENDENT_AMBULATORY_CARE_PROVIDER_SITE_OTHER): Payer: 59 | Admitting: Physician Assistant

## 2016-08-05 ENCOUNTER — Encounter: Payer: Self-pay | Admitting: Physician Assistant

## 2016-08-05 VITALS — BP 124/77 | HR 66 | Ht 66.0 in | Wt 217.0 lb

## 2016-08-05 DIAGNOSIS — J309 Allergic rhinitis, unspecified: Secondary | ICD-10-CM | POA: Diagnosis not present

## 2016-08-05 DIAGNOSIS — R5383 Other fatigue: Secondary | ICD-10-CM | POA: Insufficient documentation

## 2016-08-05 DIAGNOSIS — E039 Hypothyroidism, unspecified: Secondary | ICD-10-CM | POA: Diagnosis not present

## 2016-08-05 MED ORDER — IPRATROPIUM BROMIDE 0.03 % NA SOLN: 2.0000 | Freq: Two times a day (BID) | NASAL | 12 refills | Status: DC

## 2016-08-05 MED ORDER — METHYLPREDNISOLONE SODIUM SUCC 125 MG IJ SOLR
125.0000 mg | Freq: Once | INTRAMUSCULAR | Status: AC
  Administered 2016-08-05: 125 mg via INTRAMUSCULAR

## 2016-08-05 MED ORDER — AZELASTINE HCL 0.05 % OP SOLN: 1.0000 [drp] | Freq: Two times a day (BID) | OPHTHALMIC | 11 refills | Status: DC

## 2016-08-05 MED ORDER — PREDNISONE 20 MG PO TABS: ORAL_TABLET | ORAL | 0 refills | Status: DC

## 2016-08-05 NOTE — Patient Instructions (Signed)
Stop zyrtec and try allegra or claritin.

## 2016-08-05 NOTE — Progress Notes (Signed)
124/77    

## 2016-08-05 NOTE — Progress Notes (Addendum)
Subjective:     Patient ID: Tyrone Johnson, male   DOB: September 25, 1981, 35 y.o.   MRN: [id number]  HPI Patient is a 35 y.o. Hispanic male presenting today with complaints of chronic congestion, sinus pressure, and ear pain. The patient notes that his symptoms began in April, 2017 and was seen in-clinic. The patient notes that at that time he completed his coarse of Augmentin, Flonase, and Zyrtec. The patient notes that his symptoms have not improved since then and he often wakes up with extremely red and "crusty" eyes. The patient states that he is currently not taking any over-the-counter medication and Zrytec did not work well for him. The patient notes that his ear pain can be severe in his left ear and has caused some dizziness and nausea. The patient states that he often gets headaches every 3-4 days and sometimes takes Advil. The patient denies fever, cough, chest pain, palpitations, weight change, or  shortness of breath.  Review of Systems  Constitutional: Negative for activity change, appetite change, chills, diaphoresis, fatigue, fever and unexpected weight change.  HENT: Positive for ear pain, rhinorrhea and sinus pressure. Negative for congestion, ear discharge, postnasal drip, sneezing and sore throat.   Eyes: Positive for discharge, redness and itching. Negative for pain and visual disturbance.  Respiratory: Negative for cough, chest tightness, shortness of breath and wheezing.   Cardiovascular: Negative for chest pain, palpitations and leg swelling.  Gastrointestinal: Negative.   Endocrine: Negative.   Genitourinary: Negative.   Musculoskeletal: Negative.   Skin: Negative.   Neurological: Positive for headaches. Negative for dizziness, syncope, weakness, light-headedness and numbness.  Psychiatric/Behavioral: Negative.        Objective:   Physical Exam  Constitutional: He is oriented to person, place, and time. He appears well-developed and well-nourished. No distress.  HENT:   Head: Normocephalic and atraumatic.  Right Ear: External ear normal.  Left Ear: External ear normal.  Nose: Nose normal.  Mouth/Throat: Oropharynx is clear and moist. No oropharyngeal exudate.  Eyes: Pupils are equal, round, and reactive to light. Right eye exhibits no discharge. Left eye exhibits no discharge. No scleral icterus.  Pterygium noted bilaterally. No erythema, edema, or foreign body noted.  Neck: Normal range of motion. Neck supple. No JVD present. No tracheal deviation present. No thyromegaly present.  Cardiovascular: Normal rate, regular rhythm, normal heart sounds and intact distal pulses.  Exam reveals no gallop and no friction rub.   No murmur heard. Pulmonary/Chest: Effort normal and breath sounds normal. No stridor. No respiratory distress. He has no wheezes. He has no rales. He exhibits no tenderness.  Lymphadenopathy:    He has no cervical adenopathy.  Neurological: He is alert and oriented to person, place, and time. No cranial nerve deficit. Coordination normal.  Skin: Skin is warm and dry. No rash noted. He is not diaphoretic. No erythema. No pallor.  Psychiatric: He has a normal mood and affect. His behavior is normal. Judgment and thought content normal.      Assessment:     Diagnoses and all orders for this visit:  Allergic sinusitis -     azelastine (OPTIVAR) 0.05 % ophthalmic solution; Place 1 drop into both eyes 2 (two) times daily. -     ipratropium (ATROVENT) 0.03 % nasal spray; Place 2 sprays into both nostrils every 12 (twelve) hours. -     predniSONE (DELTASONE) 20 MG tablet; Take 3 tablets for 3 days, take 2 tablets for 3 days, take 1 tablet for 3 days,  take 1/2 tablet for 4 days.  Hypothyroidism, unspecified type -     TSH -     T4, free  No energy -     Testosterone      Plan:     1. Allergic sinusitis - Discussed with patient that his symptoms are likely allergic in etiology. Patient instructed to continue with symptomatic care such as  over-the-counter decongestants and antihistamines. Patient given solu-medrol injection in clinic today. Patient to start on prednisone 20 mg and to take 3 tablets for 3 days, take 2 tablets for 3 days, take 1 tablet for 3 days, take 1/2 tablet for 4 days. Patient to initiate treatment with Optivar ophthalmic solution for optic allergic symptoms. Patient instructed to start on Atrovent as need for symptomatic relief. Patient to return-to-clinic if symptoms do not improve or worsen.  2. Hypothyroidism- Patient reports continued levels of fatigue since increasing his synthroid dose to 28mcg. Patient to obtain serum TSH and T4 levels today. Will call patient with laboratory results and determine need for further medical intervention at that time.   3. No energy - Patient to get serum testosterone levels secondary to complaints of prolonged fatigue. Will call patient with laboratory results and determine need for further medical intervention at that time.

## 2016-08-06 ENCOUNTER — Other Ambulatory Visit: Payer: Self-pay

## 2016-08-06 LAB — TESTOSTERONE: Testosterone: 537 ng/dL (ref 250–827)

## 2016-08-06 LAB — T4, FREE: Free T4: 1.1 ng/dL (ref 0.8–1.8)

## 2016-08-06 LAB — TSH: TSH: 2.99 mIU/L (ref 0.40–4.50)

## 2016-08-06 MED ORDER — LEVOTHYROXINE SODIUM 75 MCG PO TABS: 75.0000 ug | ORAL_TABLET | Freq: Every day | ORAL | 3 refills | Status: DC

## 2016-09-17 ENCOUNTER — Ambulatory Visit (INDEPENDENT_AMBULATORY_CARE_PROVIDER_SITE_OTHER): Payer: 59 | Admitting: Physician Assistant

## 2016-09-17 ENCOUNTER — Encounter: Payer: Self-pay | Admitting: Physician Assistant

## 2016-09-17 VITALS — BP 135/76 | HR 65 | Ht 66.0 in | Wt 212.0 lb

## 2016-09-17 DIAGNOSIS — J329 Chronic sinusitis, unspecified: Secondary | ICD-10-CM | POA: Diagnosis not present

## 2016-09-17 DIAGNOSIS — B9689 Other specified bacterial agents as the cause of diseases classified elsewhere: Secondary | ICD-10-CM | POA: Diagnosis not present

## 2016-09-17 MED ORDER — AMOXICILLIN-POT CLAVULANATE 875-125 MG PO TABS: 1.0000 | ORAL_TABLET | Freq: Two times a day (BID) | ORAL | 0 refills | Status: DC

## 2016-09-17 NOTE — Patient Instructions (Signed)
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
## 2016-10-15 ENCOUNTER — Telehealth: Payer: Self-pay | Admitting: *Deleted

## 2016-10-15 NOTE — Telephone Encounter (Signed)
Pt left vm wanting you to know that the abx & flonase hasn't helped him at all.

## 2016-10-16 ENCOUNTER — Other Ambulatory Visit: Payer: Self-pay | Admitting: Physician Assistant

## 2016-10-16 ENCOUNTER — Ambulatory Visit (INDEPENDENT_AMBULATORY_CARE_PROVIDER_SITE_OTHER): Payer: 59

## 2016-10-16 DIAGNOSIS — R51 Headache: Secondary | ICD-10-CM

## 2016-10-16 DIAGNOSIS — R0981 Nasal congestion: Secondary | ICD-10-CM

## 2016-10-16 DIAGNOSIS — J329 Chronic sinusitis, unspecified: Secondary | ICD-10-CM

## 2016-10-16 DIAGNOSIS — R519 Headache, unspecified: Secondary | ICD-10-CM

## 2016-10-16 DIAGNOSIS — J320 Chronic maxillary sinusitis: Secondary | ICD-10-CM

## 2016-10-16 NOTE — Telephone Encounter (Signed)
Pt.notified

## 2016-10-16 NOTE — Telephone Encounter (Signed)
I ordered a CT they should call you in the next day to evaluate why your symptoms are un resolving.

## 2016-10-17 ENCOUNTER — Telehealth: Payer: Self-pay | Admitting: *Deleted

## 2016-10-17 DIAGNOSIS — J320 Chronic maxillary sinusitis: Secondary | ICD-10-CM

## 2016-10-17 NOTE — Telephone Encounter (Signed)
ENT referral placed.

## 2016-12-16 ENCOUNTER — Telehealth: Payer: Self-pay | Admitting: Physician Assistant

## 2016-12-16 NOTE — Telephone Encounter (Signed)
Pt got flu shot Sept '17.

## 2017-02-21 ENCOUNTER — Encounter: Payer: Self-pay | Admitting: Physician Assistant

## 2017-02-21 ENCOUNTER — Ambulatory Visit (INDEPENDENT_AMBULATORY_CARE_PROVIDER_SITE_OTHER): Payer: 59 | Admitting: Physician Assistant

## 2017-02-21 VITALS — BP 122/80 | HR 63 | Ht 66.0 in | Wt 217.0 lb

## 2017-02-21 DIAGNOSIS — Z0000 Encounter for general adult medical examination without abnormal findings: Secondary | ICD-10-CM | POA: Diagnosis not present

## 2017-02-21 DIAGNOSIS — Z13220 Encounter for screening for lipoid disorders: Secondary | ICD-10-CM

## 2017-02-21 DIAGNOSIS — J309 Allergic rhinitis, unspecified: Secondary | ICD-10-CM

## 2017-02-21 DIAGNOSIS — E039 Hypothyroidism, unspecified: Secondary | ICD-10-CM | POA: Diagnosis not present

## 2017-02-21 DIAGNOSIS — Z131 Encounter for screening for diabetes mellitus: Secondary | ICD-10-CM

## 2017-02-21 DIAGNOSIS — E7800 Pure hypercholesterolemia, unspecified: Secondary | ICD-10-CM | POA: Diagnosis not present

## 2017-02-21 DIAGNOSIS — M25561 Pain in right knee: Secondary | ICD-10-CM | POA: Diagnosis not present

## 2017-02-21 DIAGNOSIS — E781 Pure hyperglyceridemia: Secondary | ICD-10-CM

## 2017-02-21 LAB — COMPLETE METABOLIC PANEL WITH GFR
ALBUMIN: 4.5 g/dL (ref 3.6–5.1)
ALT: 128 U/L — AB (ref 9–46)
AST: 40 U/L (ref 10–40)
Alkaline Phosphatase: 98 U/L (ref 40–115)
BUN: 19 mg/dL (ref 7–25)
CO2: 27 mmol/L (ref 20–31)
Calcium: 9.5 mg/dL (ref 8.6–10.3)
Chloride: 105 mmol/L (ref 98–110)
Creat: 0.98 mg/dL (ref 0.60–1.35)
GFR, Est African American: >89 mL/min (ref >=60)
GFR, Est Non African American: >89 mL/min (ref >=60)
GLUCOSE: 92 mg/dL (ref 65–99)
POTASSIUM: 4.5 mmol/L (ref 3.5–5.3)
SODIUM: 140 mmol/L (ref 135–146)
Total Bilirubin: 0.6 mg/dL (ref 0.2–1.2)
Total Protein: 7.4 g/dL (ref 6.1–8.1)

## 2017-02-21 LAB — LIPID PANEL
Cholesterol: 234 mg/dL — ABNORMAL HIGH (ref <200)
HDL: 47 mg/dL (ref >40)
LDL CALC: 146 mg/dL — AB (ref <100)
Total CHOL/HDL Ratio: 5 Ratio — ABNORMAL HIGH (ref <5.0)
Triglycerides: 203 mg/dL — ABNORMAL HIGH (ref <150)
VLDL: 41 mg/dL — ABNORMAL HIGH (ref <30)

## 2017-02-21 MED ORDER — LEVOTHYROXINE SODIUM 75 MCG PO TABS: 75.0000 ug | ORAL_TABLET | Freq: Every day | ORAL | 1 refills | Status: DC

## 2017-02-21 MED ORDER — FEXOFENADINE-PSEUDOEPHED ER 180-240 MG PO TB24: 1.0000 | ORAL_TABLET | Freq: Every day | ORAL | 11 refills | Status: DC

## 2017-02-21 MED ORDER — MELOXICAM 15 MG PO TABS: 15.0000 mg | ORAL_TABLET | Freq: Every day | ORAL | 1 refills | Status: DC

## 2017-02-21 NOTE — Patient Instructions (Signed)
Consider probiotic for bowel movements. culturelle/align  Keeping you healthy  Get these tests  Blood pressure- Have your blood pressure checked once a year by your healthcare provider.  Normal blood pressure is 120/80.  Weight- Have your body mass index (BMI) calculated to screen for obesity.  BMI is a measure of body fat based on height and weight. You can also calculate your own BMI at [employer]velBags.it.  Cholesterol- Have your cholesterol checked regularly starting at age 38, sooner may be necessary if you have diabetes, high blood pressure, if a family member developed heart diseases at an early age or if you smoke.   Chlamydia, HIV, and other sexual transmitted disease- Get screened each year until the age of 50 then within three months of each new sexual partner.  Diabetes- Have your blood sugar checked regularly if you have high blood pressure, high cholesterol, a family history of diabetes or if you are overweight.  Get these vaccines  Flu shot- Every fall.  Tetanus shot- Every 10 years.  Menactra- Single dose; prevents meningitis.  Take these steps  Don't smoke- If you do smoke, ask your healthcare provider about quitting. For tips on how to quit, go to www.smokefree.gov or call 1-800-QUIT-NOW.  Be physically active- Exercise 5 days a week for at least 30 minutes.  If you are not already physically active start slow and gradually work up to 30 minutes of moderate physical activity.  Examples of moderate activity include walking briskly, mowing the yard, dancing, swimming bicycling, etc.  Eat a healthy diet- Eat a variety of healthy foods such as fruits, vegetables, low fat milk, low fat cheese, yogurt, lean meats, poultry, fish, beans, tofu, etc.  For more information on healthy eating, go to www.thenutritionsource.org  Drink alcohol in moderation- Limit alcohol intake two drinks or less a day.  Never drink and drive.  Dentist- Brush and floss teeth twice daily;  visit your dentis twice a year.  Depression-Your emotional health is as important as your physical health.  If you're feeling down, losing interest in things you normally enjoy please talk with your healthcare provider.  Gun Safety- If you keep a gun in your home, keep it unloaded and with the safety lock on.  Bullets should be stored separately.  Helmet use- Always wear a helmet when riding a motorcycle, bicycle, rollerblading or skateboarding.  Safe sex- If you may be exposed to a sexually transmitted infection, use a condom  Seat belts- Seat bels can save your life; always wear one.  Smoke/Carbon Monoxide detectors- These detectors need to be installed on the appropriate level of your home.  Replace batteries at least once a year.  Skin Cancer- When out in the sun, cover up and use sunscreen SPF 15 or higher.  Violence- If anyone is threatening or hurting you, please tell your healthcare provider.

## 2017-02-21 NOTE — Progress Notes (Signed)
Subjective:    Patient ID: Tyrone Johnson, male    DOB: November 22, 1980, 36 y.o.   MRN: [id number]  HPI  Pt is a 36 yo male who presents to the clinic for CPE.   .. Active Ambulatory Problems    Diagnosis Date Noted  . UNSPECIFIED HYPOTHYROIDISM 05/30/2008  . DEPRESSION 06/27/2008  . ACUTE MAXILLARY SINUSITIS 11/01/2009  . CONSTIPATION 11/06/2009  . FATTY LIVER DISEASE 04/28/2009  . SACROILIITIS, RIGHT 08/01/2008  . OTHER NONSPECIFIC ABNORMAL SERUM ENZYME LEVELS 04/24/2009  . Sleeping difficulty 08/11/2013  . Fatigue 08/11/2013  . Chronic right maxillary sinusitis 02/28/2016  . Right-sided headache 02/28/2016  . Screening for diabetes mellitus 02/28/2016  . Thyroid activity decreased 02/28/2016  . Elevated LDL cholesterol level 02/28/2016  . Allergic sinusitis 08/05/2016  . No energy 08/05/2016   Resolved Ambulatory Problems    Diagnosis Date Noted  . No Resolved Ambulatory Problems   Past Medical History:  Diagnosis Date  . Myalgia   . Thyroid disease    .Marland Kitchen Family History  Problem Relation Age of Onset  . Diabetes Mother   . Ulcers Father   . Eczema Son    .Marland Kitchen Social History   Social History  . Marital status: Married    Spouse name: N/A  . Number of children: N/A  . Years of education: N/A   Occupational History  . Not on file.   Social History Main Topics  . Smoking status: Former Research scientist (life sciences)  . Smokeless tobacco: Never Used  . Alcohol use Not on file  . Drug use: No  . Sexual activity: Yes    Birth control/ protection: Condom, Rhythm   Other Topics Concern  . Not on file   Social History Narrative  . No narrative on file   Right knee pain off and on. Tenders to hurt below joint line medial knee and radiate to below joint line of lateral knee. No known injury. Not tried anything to make better. He would like to start running.    Review of Systems  All other systems reviewed and are negative.      Objective:   Physical Exam BP 122/80    Pulse 63   Ht 5\' 6"  (1.676 m)   Wt 217 lb (98.4 kg)   BMI 35.02 kg/m   General Appearance:    Alert, cooperative, no distress, appears stated age  Head:    Normocephalic, without obvious abnormality, atraumatic  Eyes:    PERRL, conjunctiva/corneas clear, EOM's intact, fundi    benign, both eyes       Ears:    Normal TM's and external ear canals, both ears  Nose:   Nares normal, septum midline, mucosa normal, no drainage    or sinus tenderness  Throat:   Lips, mucosa, and tongue normal; teeth and gums normal  Neck:   Supple, symmetrical, trachea midline, no adenopathy;       thyroid:  No enlargement/tenderness/nodules; no carotid   bruit or JVD  Back:     Symmetric, no curvature, ROM normal, no CVA tenderness  Lungs:     Clear to auscultation bilaterally, respirations unlabored  Chest wall:    No tenderness or deformity  Heart:    Regular rate and rhythm, S1 and S2 normal, no murmur, rub   or gallop  Abdomen:     Soft, non-tender, bowel sounds active all four quadrants,    no masses, no organomegaly        Extremities:   Extremities  normal, atraumatic, no cyanosis or edema. Right Knee: NROM, no bony tenderness over patella, 5/5 lower extremity strength, pain when palpate medial tibal tuberosity in the opposite joint space below joint line on left.   Pulses:   2+ and symmetric all extremities  Skin:   Skin color, texture, turgor normal, no rashes or lesions  Lymph nodes:   Cervical, supraclavicular, and axillary nodes normal  Neurologic:   CNII-XII intact. Normal strength, sensation and reflexes      throughout         Assessment & Plan:   Marland KitchenMarland KitchenCedrik was seen today for annual exam.  Diagnoses and all orders for this visit:  Routine physical examination -     Lipid panel -     COMPLETE METABOLIC PANEL WITH GFR  Screening for diabetes mellitus -     COMPLETE METABOLIC PANEL WITH GFR  Screening for lipid disorders -     Lipid panel  Elevated LDL cholesterol level -      Lipid panel  Hypertriglyceridemia -     Lipid panel  Allergic sinusitis -     fexofenadine-pseudoephedrine (ALLEGRA-D 24) 180-240 MG 24 hr tablet; Take 1 tablet by mouth daily.  Hypothyroidism, unspecified type -     levothyroxine (SYNTHROID, LEVOTHROID) 75 MCG tablet; Take 1 tablet (75 mcg total) by mouth daily.  Acute pain of right knee -     meloxicam (MOBIC) 15 MG tablet; Take 1 tablet (15 mg total) by mouth daily.    .. Depression screen [facility] 2/9 02/21/2017  Decreased Interest 0  Down, Depressed, Hopeless 0  PHQ - 2 Score 0   Suggested to start Allegra D. Mobic for knee as needed. Suspect some inflammation at insertion of MCL. Ice as needed.  Discussed knee strengthening exercises and stability brace.  Follow up with sports med.  Generic levothyroxine sent to pharmacy.

## 2017-02-25 NOTE — Progress Notes (Signed)
Call pt: LDL increase to 146 up again from 12 months ago. With no risk factors we don't have to treat with medication. Recheck in one year if continues to be elevated will need to consider statin. Work on low fat diet/processed foods/exercise/weight loss.   One liver enzyme continues to be elevated. You do have fatty liver. Working on weight loss can help with this.

## 2017-10-10 IMAGING — CT CT MAXILLOFACIAL W/O CM
3 of 4 series · 16 of 47 positions shown, 19 images · non-contrast
Comparison: None.

CLINICAL DATA: History of recurrent sinus infections despite
interval treatment

EXAM:
CT MAXILLOFACIAL WITHOUT CONTRAST
TECHNIQUE: Multidetector CT imaging of the maxillofacial structures was
performed. Multiplanar CT image reconstructions were also generated.
A small metallic BB was placed on the right temple in order to
reliably differentiate right from left.

[Series 2: max soft · axial · 0.29mm/px · z∈[-134,+8]mm · 10 of 83 slices shown, 13 images]
[im 6/83  brain]
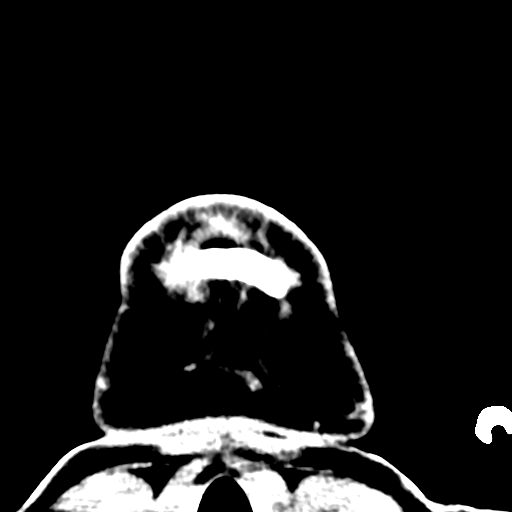
[im 6/83  bone]
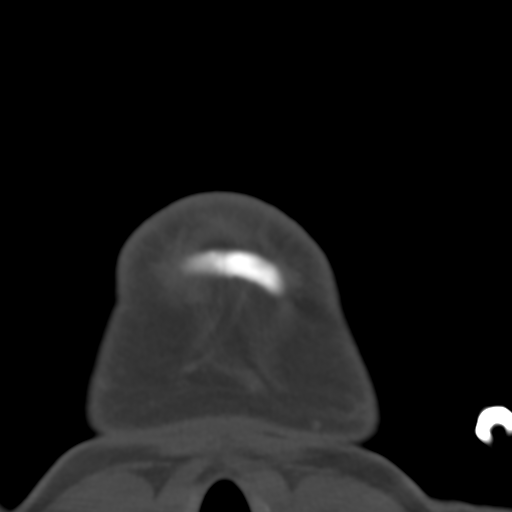
[im 12/83  bone]
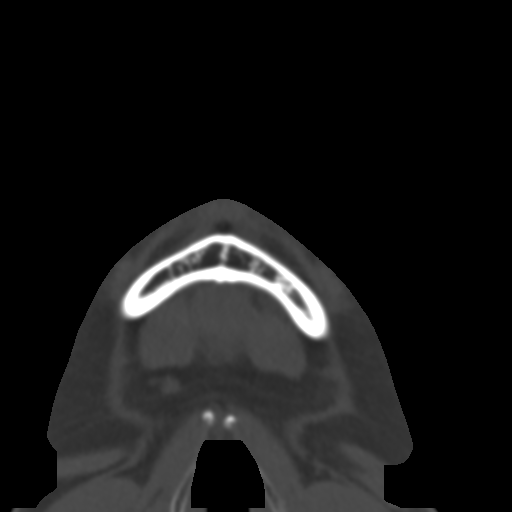
[im 24/83  bone]
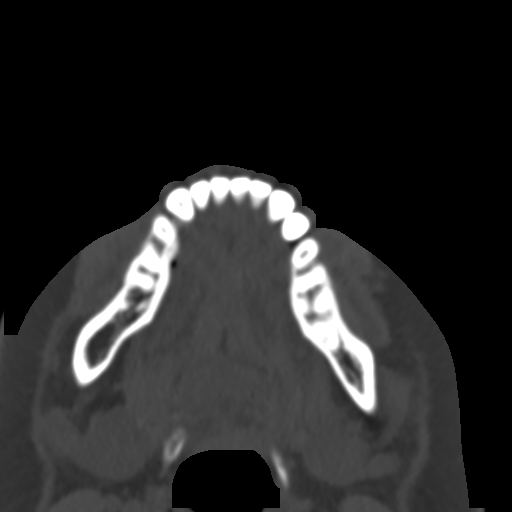
[im 30/83  bone]
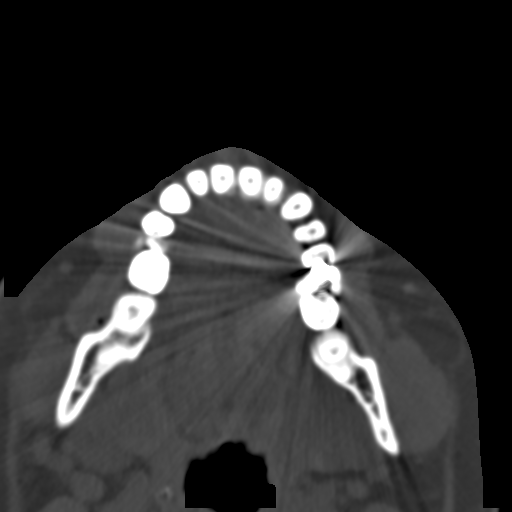
[im 36/83  brain]
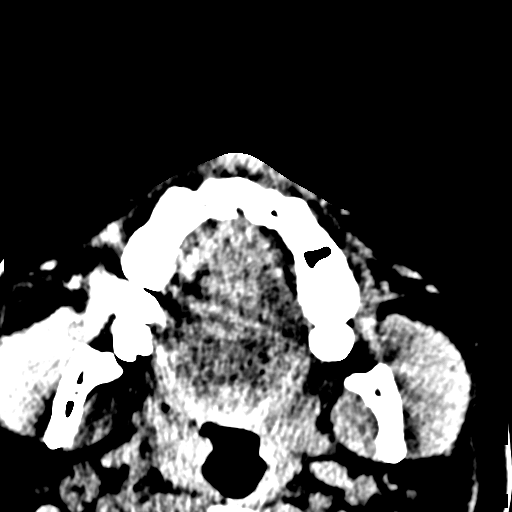
[im 36/83  bone]
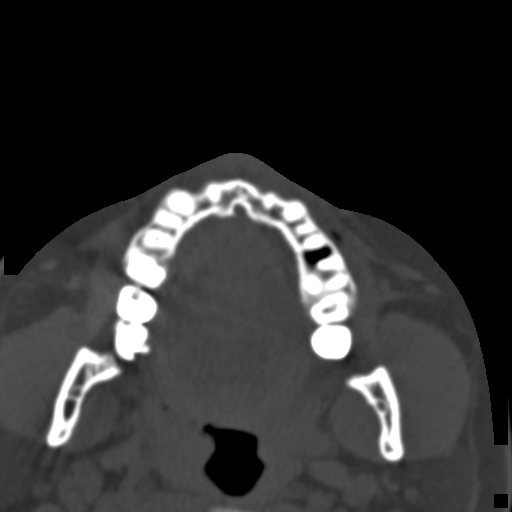
[im 47/83  bone]
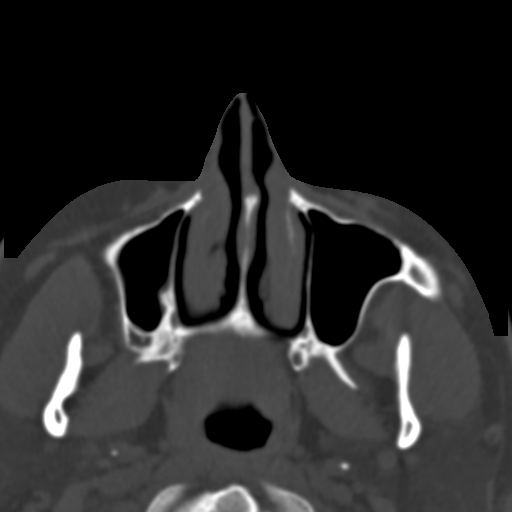
[im 53/83  bone]
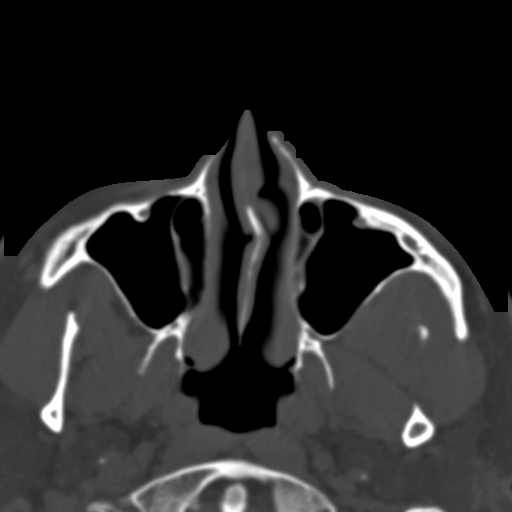
[im 59/83  bone]
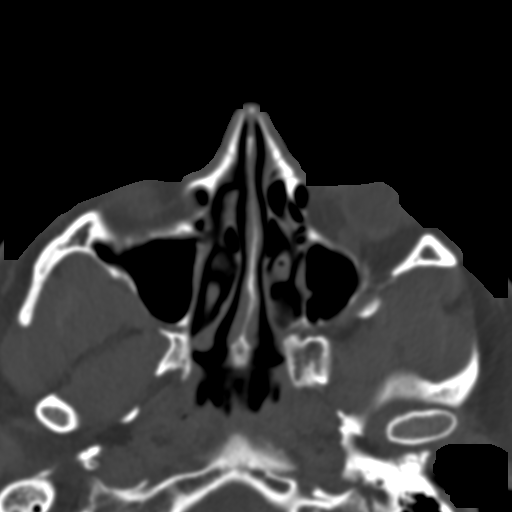
[im 71/83  brain]
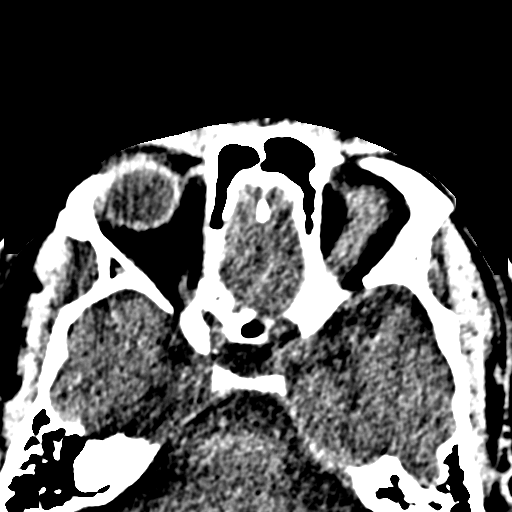
[im 71/83  bone]
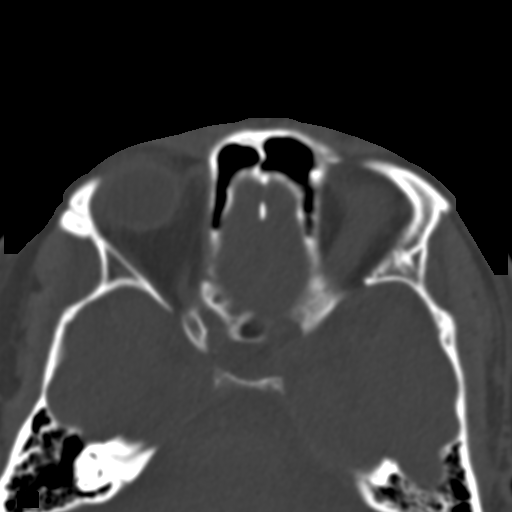
[im 77/83  bone]
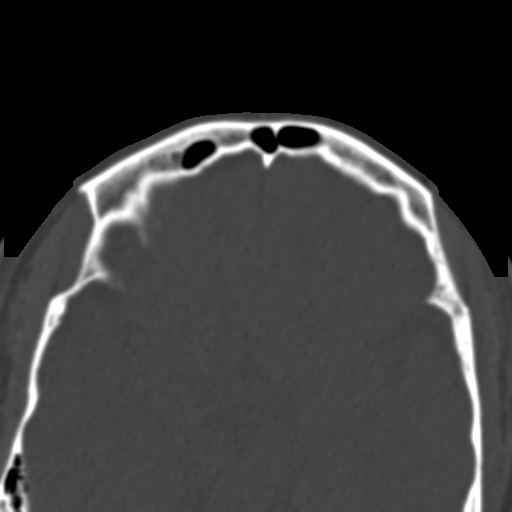

[Series 6: coronal soft · coronal · 0.36mm/px · 3 of 70 slices shown]
[im 24/70  bone]
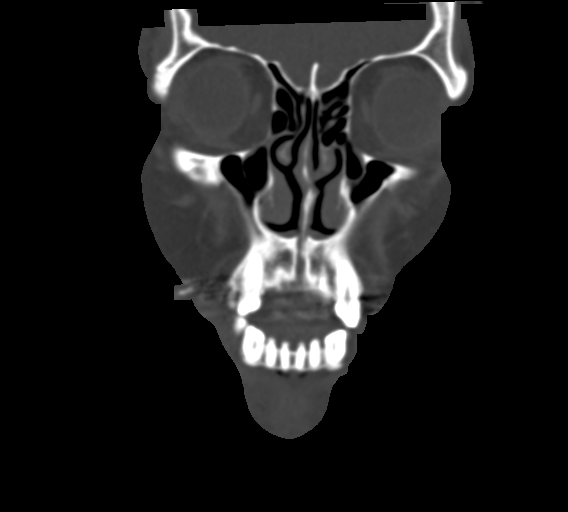
[im 31/70  bone]
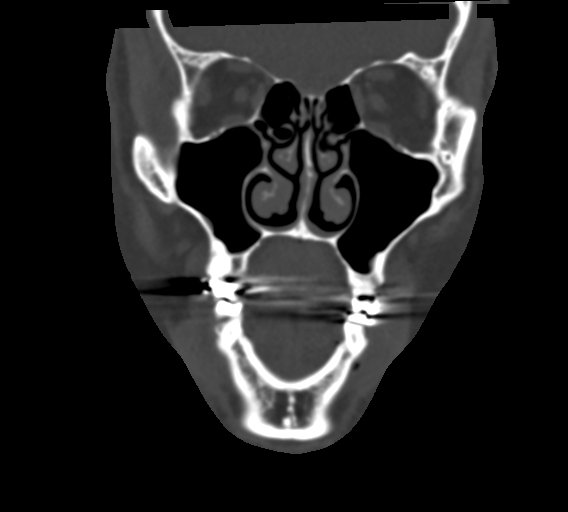
[im 39/70  bone]
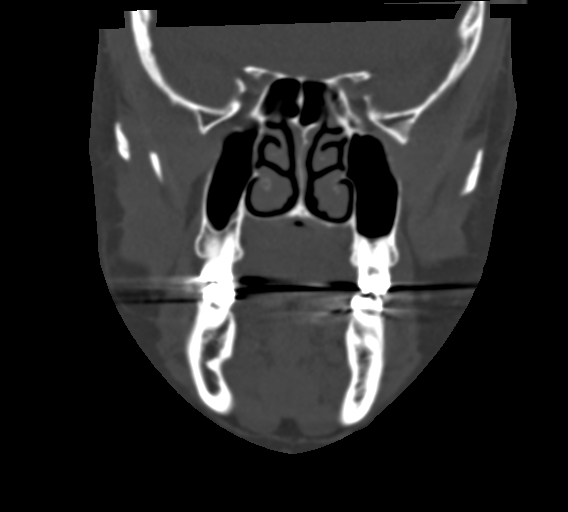

[Series 7: sagittal soft · sagittal · 0.30mm/px · 3 of 93 slices shown]
[im 31/93  bone]
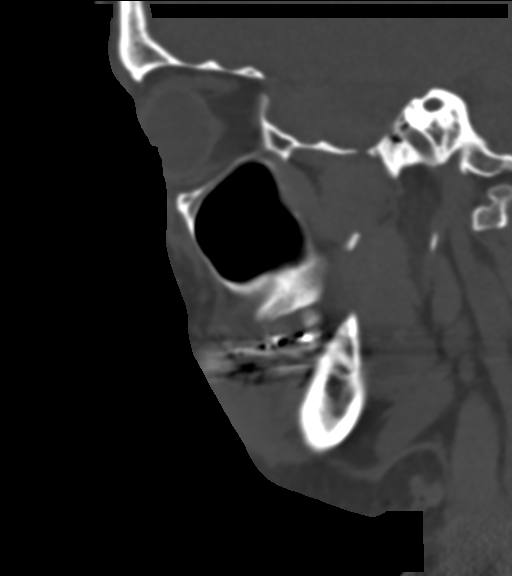
[im 47/93  bone]
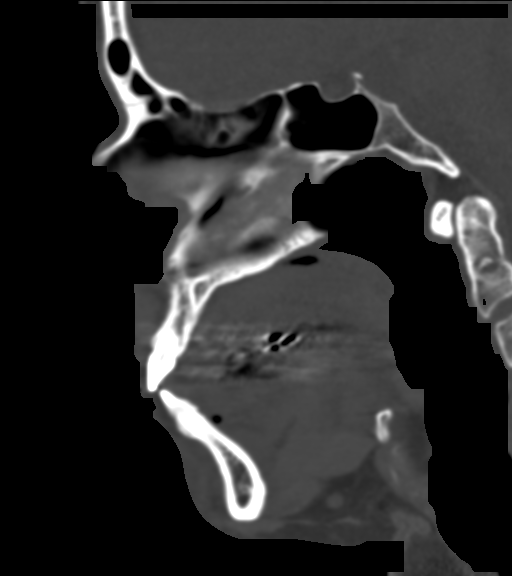
[im 62/93  bone]
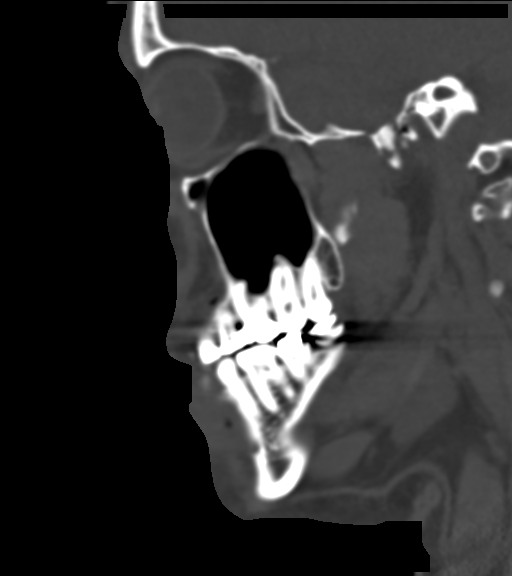

[16 of 47 positions shown; findings below may reference images not displayed]

FINDINGS: Osseous: No bony abnormality is seen. The orbital rims are intact.
The zygomatic arches appear intact and the mandibular condyles
appear to be in normal position. The nasal bone is intact.

Orbits: The orbital rims appear intact and no periorbital air is
seen. The extraocular musculature is unremarkable.

Sinuses: The paranasal sinuses are well pneumatized. Very minimal
mucosal thickening is seen in the floor the right maxillary sinus
laterally.

Soft tissues: No soft tissue abnormality is seen.

Limited intracranial: The limited intracranial portion of this study
shows no significant abnormality.
IMPRESSION: Negative CT of the paranasal sinuses.  No present sinusitis.

## 2017-12-10 ENCOUNTER — Other Ambulatory Visit: Payer: Self-pay | Admitting: Physician Assistant

## 2017-12-10 DIAGNOSIS — E039 Hypothyroidism, unspecified: Secondary | ICD-10-CM

## 2018-09-02 ENCOUNTER — Other Ambulatory Visit: Payer: Self-pay | Admitting: Physician Assistant

## 2018-09-02 DIAGNOSIS — E039 Hypothyroidism, unspecified: Secondary | ICD-10-CM

## 2018-10-06 ENCOUNTER — Other Ambulatory Visit: Payer: Self-pay

## 2018-10-06 DIAGNOSIS — E039 Hypothyroidism, unspecified: Secondary | ICD-10-CM

## 2018-10-12 MED ORDER — LEVOTHYROXINE SODIUM 75 MCG PO TABS: 75.0000 ug | ORAL_TABLET | Freq: Every day | ORAL | 0 refills | Status: DC

## 2018-11-16 ENCOUNTER — Other Ambulatory Visit: Payer: Self-pay | Admitting: Physician Assistant

## 2018-11-16 DIAGNOSIS — E039 Hypothyroidism, unspecified: Secondary | ICD-10-CM

## 2018-11-27 ENCOUNTER — Ambulatory Visit (INDEPENDENT_AMBULATORY_CARE_PROVIDER_SITE_OTHER): Payer: BLUE CROSS/BLUE SHIELD

## 2018-11-27 ENCOUNTER — Encounter: Payer: Self-pay | Admitting: Physician Assistant

## 2018-11-27 ENCOUNTER — Ambulatory Visit (INDEPENDENT_AMBULATORY_CARE_PROVIDER_SITE_OTHER): Payer: BLUE CROSS/BLUE SHIELD | Admitting: Physician Assistant

## 2018-11-27 VITALS — BP 120/90 | HR 74 | Ht 66.0 in | Wt 216.0 lb

## 2018-11-27 DIAGNOSIS — Z0000 Encounter for general adult medical examination without abnormal findings: Secondary | ICD-10-CM | POA: Diagnosis not present

## 2018-11-27 DIAGNOSIS — Z13220 Encounter for screening for lipoid disorders: Secondary | ICD-10-CM | POA: Diagnosis not present

## 2018-11-27 DIAGNOSIS — E559 Vitamin D deficiency, unspecified: Secondary | ICD-10-CM

## 2018-11-27 DIAGNOSIS — R5383 Other fatigue: Secondary | ICD-10-CM

## 2018-11-27 DIAGNOSIS — E039 Hypothyroidism, unspecified: Secondary | ICD-10-CM

## 2018-11-27 DIAGNOSIS — R140 Abdominal distension (gaseous): Secondary | ICD-10-CM | POA: Diagnosis not present

## 2018-11-27 DIAGNOSIS — Z131 Encounter for screening for diabetes mellitus: Secondary | ICD-10-CM | POA: Diagnosis not present

## 2018-11-27 DIAGNOSIS — K5900 Constipation, unspecified: Secondary | ICD-10-CM

## 2018-11-27 NOTE — Patient Instructions (Addendum)
Start probiotic culturelle.  Start watching food triggers.  Get abdominal xray.   Health Maintenance, Male A healthy lifestyle and preventive care is important for your health and wellness. Ask your health care provider about what schedule of regular examinations is right for you. What should I know about weight and diet? Eat a Healthy Diet  Eat plenty of vegetables, fruits, whole grains, low-fat dairy products, and lean protein.  Do not eat a lot of foods high in solid fats, added sugars, or salt.  Maintain a Healthy Weight Regular exercise can help you achieve or maintain a healthy weight. You should:  Do at least 150 minutes of exercise each week. The exercise should increase your heart rate and make you sweat (moderate-intensity exercise).  Do strength-training exercises at least twice a week. Watch Your Levels of Cholesterol and Blood Lipids  Have your blood tested for lipids and cholesterol every 5 years starting at 36 years of age. If you are at high risk for heart disease, you should start having your blood tested when you are 36 years old. You may need to have your cholesterol levels checked more often if: ? Your lipid or cholesterol levels are high. ? You are older than 36 years of age. ? You are at high risk for heart disease. What should I know about cancer screening? Many types of cancers can be detected early and may often be prevented. Lung Cancer  You should be screened every year for lung cancer if: ? You are a current smoker who has smoked for at least 30 years. ? You are a former smoker who has quit within the past 15 years.  Talk to your health care provider about your screening options, when you should start screening, and how often you should be screened. Colorectal Cancer  Routine colorectal cancer screening usually begins at 36 years of age and should be repeated every 5-10 years until you are 36 years old. You may need to be screened more often if early  forms of precancerous polyps or small growths are found. Your health care provider may recommend screening at an earlier age if you have risk factors for colon cancer.  Your health care provider may recommend using home test kits to check for hidden blood in the stool.  A small camera at the end of a tube can be used to examine your colon (sigmoidoscopy or colonoscopy). This checks for the earliest forms of colorectal cancer. Prostate and Testicular Cancer  Depending on your age and overall health, your health care provider may do certain tests to screen for prostate and testicular cancer.  Talk to your health care provider about any symptoms or concerns you have about testicular or prostate cancer. Skin Cancer  Check your skin from head to toe regularly.  Tell your health care provider about any new moles or changes in moles, especially if: ? There is a change in a mole's size, shape, or color. ? You have a mole that is larger than a pencil eraser.  Always use sunscreen. Apply sunscreen liberally and repeat throughout the day.  Protect yourself by wearing long sleeves, pants, a wide-brimmed hat, and sunglasses when outside. What should I know about heart disease, diabetes, and high blood pressure?  If you are 21-27 years of age, have your blood pressure checked every 3-5 years. If you are 43 years of age or older, have your blood pressure checked every year. You should have your blood pressure measured twice-once when you are  at a hospital or clinic, and once when you are not at a hospital or clinic. Record the average of the two measurements. To check your blood pressure when you are not at a hospital or clinic, you can use: ? An automated blood pressure machine at a pharmacy. ? A home blood pressure monitor.  Talk to your health care provider about your target blood pressure.  If you are between 37-69 years old, ask your health care provider if you should take aspirin to prevent heart  disease.  Have regular diabetes screenings by checking your fasting blood sugar level. ? If you are at a normal weight and have a low risk for diabetes, have this test once every three years after the age of 21. ? If you are overweight and have a high risk for diabetes, consider being tested at a younger age or more often.  A one-time screening for abdominal aortic aneurysm (AAA) by ultrasound is recommended for men aged 36-75 years who are current or former smokers. What should I know about preventing infection? Hepatitis B If you have a higher risk for hepatitis B, you should be screened for this virus. Talk with your health care provider to find out if you are at risk for hepatitis B infection. Hepatitis C Blood testing is recommended for:  Everyone born from [birth year] through 1963.  Anyone with known risk factors for hepatitis C. Sexually Transmitted Diseases (STDs)  You should be screened each year for STDs including gonorrhea and chlamydia if: ? You are sexually active and are younger than 36 years of age. ? You are older than 36 years of age and your health care provider tells you that you are at risk for this type of infection. ? Your sexual activity has changed since you were last screened and you are at an increased risk for chlamydia or gonorrhea. Ask your health care provider if you are at risk.  Talk with your health care provider about whether you are at high risk of being infected with HIV. Your health care provider may recommend a prescription medicine to help prevent HIV infection. What else can I do?  Schedule regular health, dental, and eye exams.  Stay current with your vaccines (immunizations).  Do not use any tobacco products, such as cigarettes, chewing tobacco, and e-cigarettes. If you need help quitting, ask your health care provider.  Limit alcohol intake to no more than 2 drinks per day. One drink equals 12 ounces of beer, 5 ounces of wine, or 1 ounces of hard  liquor.  Do not use street drugs.  Do not share needles.  Ask your health care provider for help if you need support or information about quitting drugs.  Tell your health care provider if you often feel depressed.  Tell your health care provider if you have ever been abused or do not feel safe at home. This information is not intended to replace advice given to you by your health care provider. Make sure you discuss any questions you have with your health care provider. Document Released: 04/18/2008 Document Revised: 06/19/2016 Document Reviewed: 07/25/2015 Elsevier Interactive Patient Education  2019 [employer].

## 2018-11-27 NOTE — Progress Notes (Signed)
Call pt: moderate stool burden. Need to get you cleaned out. Start 1 capful of miralax daily and may increase to 2 capful daily until full bowel movements.

## 2018-11-27 NOTE — Progress Notes (Signed)
Subjective:    Patient ID: Tyrone Johnson, male    DOB: Dec 11, 1980, 38 y.o.   MRN: [id number]  HPI  Patient is a 38 year old male who presents to the clinic for complete physical.  He reports he is taking his thyroid medication daily.  He does report some ongoing fatigue.  He sleeps well only 7 hours a night and wakes up feeling rested.  He quickly goes downhill.  He is not exercising.  He is not on any particular diet.  He does report some intermittent bloating and abdominal discomfort.  He does notice that dairy triggers more bloating.  He does try to avoid dairy.  He has not tried anything to make better.  His bowel movements are once a day or every other day.  He does report that they are very small in nature but not hard.  Denies any melena or hematochezia.  Marland Kitchen. Active Ambulatory Problems    Diagnosis Date Noted  . UNSPECIFIED HYPOTHYROIDISM 05/30/2008  . DEPRESSION 06/27/2008  . ACUTE MAXILLARY SINUSITIS 11/01/2009  . CONSTIPATION 11/06/2009  . FATTY LIVER DISEASE 04/28/2009  . SACROILIITIS, RIGHT 08/01/2008  . OTHER NONSPECIFIC ABNORMAL SERUM ENZYME LEVELS 04/24/2009  . Sleeping difficulty 08/11/2013  . Fatigue 08/11/2013  . Chronic right maxillary sinusitis 02/28/2016  . Right-sided headache 02/28/2016  . Screening for diabetes mellitus 02/28/2016  . Thyroid activity decreased 02/28/2016  . Elevated LDL cholesterol level 02/28/2016  . Allergic sinusitis 08/05/2016  . No energy 08/05/2016   Resolved Ambulatory Problems    Diagnosis Date Noted  . No Resolved Ambulatory Problems   Past Medical History:  Diagnosis Date  . Myalgia   . Thyroid disease      Review of Systems  All other systems reviewed and are negative.      Objective:   Physical Exam BP 120/90   Pulse 74   Ht 5\' 6"  (1.676 m)   Wt 216 lb (98 kg)   BMI 34.86 kg/m   General Appearance:    Alert, cooperative, no distress, appears stated age  Head:    Normocephalic, without obvious  abnormality, atraumatic  Eyes:    PERRL, conjunctiva/corneas clear, EOM's intact, fundi    benign, both eyes, left eye pterygium     Ears:    Normal TM's and external ear canals, both ears  Nose:   Nares normal, septum midline, mucosa normal, no drainage    or sinus tenderness  Throat:   Lips, mucosa, and tongue normal; teeth and gums normal  Neck:   Supple, symmetrical, trachea midline, no adenopathy;       thyroid:  No enlargement/tenderness/nodules; no carotid   bruit or JVD  Back:     Symmetric, no curvature, ROM normal, no CVA tenderness  Lungs:     Clear to auscultation bilaterally, respirations unlabored  Chest wall:    No tenderness or deformity  Heart:    Regular rate and rhythm, S1 and S2 normal, no murmur, rub   or gallop  Abdomen:     Soft, non-tender, bowel sounds active all four quadrants,    no masses, no organomegaly        Extremities:   Extremities normal, atraumatic, no cyanosis or edema  Pulses:   2+ and symmetric all extremities  Skin:   Skin color, texture, turgor normal, no rashes or lesions  Lymph nodes:   Cervical, supraclavicular, and axillary nodes normal  Neurologic:   CNII-XII intact. Normal strength, sensation and reflexes  throughout        Assessment & Plan:  Marland KitchenMarland KitchenDuke was seen today for annual exam.  Diagnoses and all orders for this visit:  Routine physical examination -     Lipid Panel w/reflex Direct LDL -     COMPLETE METABOLIC PANEL WITH GFR -     TSH -     DG Abd 1 View -     B12 -     VITAMIN D 25 Hydroxy (Vit-D Deficiency, Fractures)  Screening for diabetes mellitus -     COMPLETE METABOLIC PANEL WITH GFR  Screening for lipid disorders -     Lipid Panel w/reflex Direct LDL  Acquired hypothyroidism -     TSH  Bloating -     DG Abd 1 View  No energy -     B12 -     VITAMIN D 25 Hydroxy (Vit-D Deficiency, Fractures)   .Marland Kitchen Depression screen [facility] 2/9 11/27/2018 02/21/2017  Decreased Interest 0 0  Down, Depressed, Hopeless 0  0  PHQ - 2 Score 0 0   .Start a regular exercise program and make sure you are eating a healthy diet Try to eat 4 servings of dairy a day or take a calcium supplement (500mg  twice a day). Your vaccines are up to date.  Will get fasting labs.  Pt declined flu shot.   Discussed abdominal symptoms. Start probiotic. Xray ordered today to assess stool burden. He could benefit from some miralax. Start keeping a food diary and watching for triggers. Could use gas-x for bloating. Follow up in 4-6 weeks if not improving.   Discussed lower risk for sleep apnea may consider if labs are normal.

## 2018-11-28 LAB — COMPLETE METABOLIC PANEL WITH GFR
AG Ratio: 1.7 (calc) (ref 1.0–2.5)
ALBUMIN MSPROF: 4.7 g/dL (ref 3.6–5.1)
ALT: 137 U/L — ABNORMAL HIGH (ref 9–46)
AST: 40 U/L (ref 10–40)
Alkaline phosphatase (APISO): 91 U/L (ref 40–115)
BUN: 14 mg/dL (ref 7–25)
CALCIUM: 9.7 mg/dL (ref 8.6–10.3)
CO2: 25 mmol/L (ref 20–32)
CREATININE: 0.93 mg/dL (ref 0.60–1.35)
Chloride: 106 mmol/L (ref 98–110)
GFR, EST AFRICAN AMERICAN: 123 mL/min/{1.73_m2} (ref >=60)
GFR, EST NON AFRICAN AMERICAN: 106 mL/min/{1.73_m2} (ref >=60)
GLOBULIN: 2.8 g/dL (ref 1.9–3.7)
Glucose, Bld: 96 mg/dL (ref 65–99)
Potassium: 4.2 mmol/L (ref 3.5–5.3)
SODIUM: 141 mmol/L (ref 135–146)
TOTAL PROTEIN: 7.5 g/dL (ref 6.1–8.1)
Total Bilirubin: 0.6 mg/dL (ref 0.2–1.2)

## 2018-11-28 LAB — LIPID PANEL W/REFLEX DIRECT LDL
CHOLESTEROL: 226 mg/dL — AB (ref <200)
HDL: 52 mg/dL (ref >40)
LDL Cholesterol (Calc): 145 mg/dL (calc) — ABNORMAL HIGH
Non-HDL Cholesterol (Calc): 174 mg/dL (calc) — ABNORMAL HIGH (ref <130)
Total CHOL/HDL Ratio: 4.3 (calc) (ref <5.0)
Triglycerides: 155 mg/dL — ABNORMAL HIGH (ref <150)

## 2018-11-28 LAB — VITAMIN D 25 HYDROXY (VIT D DEFICIENCY, FRACTURES): Vit D, 25-Hydroxy: 13 ng/mL — ABNORMAL LOW (ref 30–100)

## 2018-11-28 LAB — TSH: TSH: 2.29 mIU/L (ref 0.40–4.50)

## 2018-11-28 LAB — VITAMIN B12: Vitamin B-12: 551 pg/mL (ref 200–1100)

## 2018-11-30 DIAGNOSIS — E559 Vitamin D deficiency, unspecified: Secondary | ICD-10-CM | POA: Insufficient documentation

## 2018-11-30 MED ORDER — VITAMIN D (ERGOCALCIFEROL) 1.25 MG (50000 UNIT) PO CAPS: 50000.0000 [IU] | ORAL_CAPSULE | ORAL | 0 refills | Status: DC

## 2018-11-30 NOTE — Progress Notes (Signed)
Call pt: HDL looks good. LDL still elevated. TG down some. I think a low dose cholesterol medication would lower CV risk and help to get your cholesterol to goal. What are your thoughts?  Vitamin D very low and could be causing your fatigue. Will send over high dose weekly vitamin D and then recheck in 3 months.

## 2018-11-30 NOTE — Addendum Note (Signed)
Addended byDonella Stade on: 11/30/2018 01:20 PM   Modules accepted: Orders

## 2018-12-01 ENCOUNTER — Other Ambulatory Visit: Payer: Self-pay | Admitting: Physician Assistant

## 2018-12-01 DIAGNOSIS — E039 Hypothyroidism, unspecified: Secondary | ICD-10-CM

## 2019-04-05 ENCOUNTER — Other Ambulatory Visit: Payer: Self-pay | Admitting: Physician Assistant

## 2019-04-05 DIAGNOSIS — E039 Hypothyroidism, unspecified: Secondary | ICD-10-CM

## 2020-01-03 ENCOUNTER — Other Ambulatory Visit: Payer: Self-pay | Admitting: Neurology

## 2020-01-03 DIAGNOSIS — E039 Hypothyroidism, unspecified: Secondary | ICD-10-CM

## 2020-01-03 MED ORDER — LEVOTHYROXINE SODIUM 75 MCG PO TABS: 75.0000 ug | ORAL_TABLET | Freq: Every day | ORAL | 0 refills | Status: DC

## 2020-03-29 ENCOUNTER — Other Ambulatory Visit: Payer: Self-pay

## 2020-03-29 ENCOUNTER — Ambulatory Visit (INDEPENDENT_AMBULATORY_CARE_PROVIDER_SITE_OTHER): Payer: BC Managed Care – PPO | Admitting: Physician Assistant

## 2020-03-29 ENCOUNTER — Encounter: Payer: Self-pay | Admitting: Physician Assistant

## 2020-03-29 VITALS — BP 136/84 | HR 58 | Ht 66.0 in | Wt 216.0 lb

## 2020-03-29 DIAGNOSIS — Z0000 Encounter for general adult medical examination without abnormal findings: Secondary | ICD-10-CM | POA: Diagnosis not present

## 2020-03-29 DIAGNOSIS — E039 Hypothyroidism, unspecified: Secondary | ICD-10-CM | POA: Diagnosis not present

## 2020-03-29 DIAGNOSIS — Z131 Encounter for screening for diabetes mellitus: Secondary | ICD-10-CM | POA: Diagnosis not present

## 2020-03-29 DIAGNOSIS — Z23 Encounter for immunization: Secondary | ICD-10-CM

## 2020-03-29 DIAGNOSIS — M25511 Pain in right shoulder: Secondary | ICD-10-CM

## 2020-03-29 DIAGNOSIS — R5383 Other fatigue: Secondary | ICD-10-CM

## 2020-03-29 DIAGNOSIS — Z13220 Encounter for screening for lipoid disorders: Secondary | ICD-10-CM

## 2020-03-29 NOTE — Progress Notes (Signed)
Subjective:    Patient ID: Tyrone Johnson, male    DOB: 01/21/1981, 39 y.o.   MRN: [id number]  HPI  Patient is a 39 year old male with hypothyroidism, vitamin D deficiency who presents to the clinic for yearly physical.  Patient does mention more fatigue over the past 6 months.  He is sleeping well and wakes up feeling rested.  He has been on same thyroid medicine for a while now and wonders if his thyroid is out of range.  He admits to stopping his vitamin D months ago.  He denies any depression.  Patient does report some right shoulder pain for the last 6 weeks.  He denies any known injury.  He does do a lot of lifting, pushing, pulling.  The pain is worse with external range of motion.  He has not done anything to make better.  Some days is better than others. He occasionally will have some tingling run down his arm.    .. Active Ambulatory Problems    Diagnosis Date Noted  . UNSPECIFIED HYPOTHYROIDISM 05/30/2008  . DEPRESSION 06/27/2008  . ACUTE MAXILLARY SINUSITIS 11/01/2009  . CONSTIPATION 11/06/2009  . FATTY LIVER DISEASE 04/28/2009  . SACROILIITIS, RIGHT 08/01/2008  . OTHER NONSPECIFIC ABNORMAL SERUM ENZYME LEVELS 04/24/2009  . Sleeping difficulty 08/11/2013  . Fatigue 08/11/2013  . Chronic right maxillary sinusitis 02/28/2016  . Right-sided headache 02/28/2016  . Screening for diabetes mellitus 02/28/2016  . Thyroid activity decreased 02/28/2016  . Elevated LDL cholesterol level 02/28/2016  . Allergic sinusitis 08/05/2016  . No energy 08/05/2016  . Bloating 11/27/2018  . Vitamin D deficiency 11/30/2018   Resolved Ambulatory Problems    Diagnosis Date Noted  . No Resolved Ambulatory Problems   Past Medical History:  Diagnosis Date  . Myalgia   . Thyroid disease    .Marland Kitchen Social History   Socioeconomic History  . Marital status: Married    Spouse name: Not on file  . Number of children: Not on file  . Years of education: Not on file  . Highest education  level: Not on file  Occupational History  . Not on file  Tobacco Use  . Smoking status: Former Research scientist (life sciences)  . Smokeless tobacco: Never Used  Substance and Sexual Activity  . Alcohol use: Not on file  . Drug use: No  . Sexual activity: Yes    Birth control/protection: Condom, Rhythm  Other Topics Concern  . Not on file  Social History Narrative  . Not on file   Social Determinants of Health   Financial Resource Strain:   . Difficulty of Paying Living Expenses:   Food Insecurity:   . Worried About Charity fundraiser in the Last Year:   . Arboriculturist in the Last Year:   Transportation Needs:   . Film/video editor (Medical):   Marland Kitchen Lack of Transportation (Non-Medical):   Physical Activity:   . Days of Exercise per Week:   . Minutes of Exercise per Session:   Stress:   . Feeling of Stress :   Social Connections:   . Frequency of Communication with Friends and Family:   . Frequency of Social Gatherings with Friends and Family:   . Attends Religious Services:   . Active Member of Clubs or Organizations:   . Attends Archivist Meetings:   Marland Kitchen Marital Status:   Intimate Partner Violence:   . Fear of Current or Ex-Partner:   . Emotionally Abused:   Marland Kitchen Physically Abused:   .  Sexually Abused:    .Marland Kitchen Family History  Problem Relation Age of Onset  . Diabetes Mother   . Ulcers Father   . Eczema Son      Review of Systems  All other systems reviewed and are negative.  See HPI.     Objective:   Physical Exam BP 136/84   Pulse (!) 58   Ht 5\' 6"  (1.676 m)   Wt 216 lb (98 kg)   SpO2 99%   BMI 34.86 kg/m   General Appearance:    Alert, cooperative, no distress, appears stated age  Head:    Normocephalic, without obvious abnormality, atraumatic  Eyes:    PERRL, conjunctiva/corneas clear, EOM's intact, fundi    benign, both eyes       Ears:    Normal TM's and external ear canals, both ears  Nose:   Nares normal, septum midline, mucosa normal, no drainage     or sinus tenderness  Throat:   Lips, mucosa, and tongue normal; teeth and gums normal  Neck:   Supple, symmetrical, trachea midline, no adenopathy;       thyroid:  No enlargement/tenderness/nodules; no carotid   bruit or JVD  Back:     Symmetric, no curvature, ROM normal, no CVA tenderness  Lungs:     Clear to auscultation bilaterally, respirations unlabored  Chest wall:    No tenderness or deformity  Heart:    Regular rate and rhythm, S1 and S2 normal, no murmur, rub   or gallop  Abdomen:     Soft, non-tender, bowel sounds active all four quadrants,    no masses, no organomegaly        Extremities:   Extremities normal, atraumatic, no cyanosis or edema. Right shoulder: NROM, pain with external ROM. Pain with palpation over AC joint. Strength 5/5.   Pulses:   2+ and symmetric all extremities  Skin:   Skin color, texture, turgor normal, no rashes or lesions  Lymph nodes:   Cervical, supraclavicular, and axillary nodes normal  Neurologic:   CNII-XII intact. Normal strength, sensation and reflexes      throughout         Assessment & Plan:  Marland KitchenMarland KitchenDavion was seen today for annual exam.  Diagnoses and all orders for this visit:  Routine physical examination  Fatigue, unspecified type -     TSH -     VITAMIN D 25 Hydroxy (Vit-D Deficiency, Fractures) -     COMPLETE METABOLIC PANEL WITH GFR -     CBC -     Lipid Panel w/reflex Direct LDL -     B12 and Folate Panel -     Testosterone  Screening for diabetes mellitus -     COMPLETE METABOLIC PANEL WITH GFR  Screening for lipid disorders -     Lipid Panel w/reflex Direct LDL  Acquired hypothyroidism -     TSH  Acute pain of right shoulder -     DG Shoulder Right  Need for Tdap vaccination -     Tdap vaccine greater than or equal to 7yo IM   .Marland Kitchen Depression screen [facility] 2/9 03/29/2020 11/27/2018 02/21/2017  Decreased Interest 0 0 0  Down, Depressed, Hopeless 0 0 0  PHQ - 2 Score 0 0 0  Altered sleeping 0 - -  Tired, decreased  energy 1 - -  Change in appetite 0 - -  Feeling bad or failure about yourself  0 - -  Trouble concentrating 0 - -  Moving slowly or fidgety/restless 0 - -  Suicidal thoughts 0 - -  PHQ-9 Score 1 - -  Difficult doing work/chores Not difficult at all - -   .Marland KitchenStart a regular exercise program and make sure you are eating a healthy diet Try to eat 4 servings of dairy a day or take a calcium supplement (500mg  twice a day). Tdap given today. Your vaccines are up to date.  Fasting labs ordered today.  For fatigue we will add recheck of thyroid, testosterone, B12, vitamin D.  If fatigue persist with no abnormalities will consider sleep study.  Right shoulder pain seems more like impingement syndrome.  Will get x-ray today.  Can use anti-inflammatories as needed.  Gave patient shoulder exercises to start.  If pain persist past 2 weeks follow-up with Dr. Darene Lamer in office.  Patient was given handout with Dr. Mcneil Sober information.

## 2020-03-29 NOTE — Patient Instructions (Addendum)
  Health Maintenance, Male Adopting a healthy lifestyle and getting preventive care are important in promoting health and wellness. Ask your health care provider about:  The right schedule for you to have regular tests and exams.  Things you can do on your own to prevent diseases and keep yourself healthy. What should I know about diet, weight, and exercise? Eat a healthy diet   Eat a diet that includes plenty of vegetables, fruits, low-fat dairy products, and lean protein.  Do not eat a lot of foods that are high in solid fats, added sugars, or sodium. Maintain a healthy weight Body mass index (BMI) is a measurement that can be used to identify possible weight problems. It estimates body fat based on height and weight. Your health care provider can help determine your BMI and help you achieve or maintain a healthy weight. Get regular exercise Get regular exercise. This is one of the most important things you can do for your health. Most adults should:  Exercise for at least 150 minutes each week. The exercise should increase your heart rate and make you sweat (moderate-intensity exercise).  Do strengthening exercises at least twice a week. This is in addition to the moderate-intensity exercise.  Spend less time sitting. Even light physical activity can be beneficial. Watch cholesterol and blood lipids Have your blood tested for lipids and cholesterol at 37 years of age, then have this test every 5 years. You may need to have your cholesterol levels checked more often if:  Your lipid or cholesterol levels are high.  You are older than 37 years of age.  You are at high risk for heart disease. What should I know about cancer screening? Many types of cancers can be detected early and may often be prevented. Depending on your health history and family history, you may need to have cancer screening at various ages. This may include screening for:  Colorectal cancer.  Prostate  cancer.  Skin cancer.  Lung cancer. What should I know about heart disease, diabetes, and high blood pressure? Blood pressure and heart disease  High blood pressure causes heart disease and increases the risk of stroke. This is more likely to develop in people who have high blood pressure readings, are of African descent, or are overweight.  Talk with your health care provider about your target blood pressure readings.  Have your blood pressure checked: ? Every 3-5 years if you are 18-39 years of age. ? Every year if you are 40 years old or older.  If you are between the ages of 65 and 75 and are a current or former smoker, ask your health care provider if you should have a one-time screening for abdominal aortic aneurysm (AAA). Diabetes Have regular diabetes screenings. This checks your fasting blood sugar level. Have the screening done:  Once every three years after age 45 if you are at a normal weight and have a low risk for diabetes.  More often and at a younger age if you are overweight or have a high risk for diabetes. What should I know about preventing infection? Hepatitis B If you have a higher risk for hepatitis B, you should be screened for this virus. Talk with your health care provider to find out if you are at risk for hepatitis B infection. Hepatitis C Blood testing is recommended for:  Everyone born from 1945 through 1965.  Anyone with known risk factors for hepatitis C. Sexually transmitted infections (STIs)  You should be screened   each year for STIs, including gonorrhea and chlamydia, if: ? You are sexually active and are younger than 37 years of age. ? You are older than 37 years of age and your health care provider tells you that you are at risk for this type of infection. ? Your sexual activity has changed since you were last screened, and you are at increased risk for chlamydia or gonorrhea. Ask your health care provider if you are at risk.  Ask your  health care provider about whether you are at high risk for HIV. Your health care provider may recommend a prescription medicine to help prevent HIV infection. If you choose to take medicine to prevent HIV, you should first get tested for HIV. You should then be tested every 3 months for as long as you are taking the medicine. Follow these instructions at home: Lifestyle  Do not use any products that contain nicotine or tobacco, such as cigarettes, e-cigarettes, and chewing tobacco. If you need help quitting, ask your health care provider.  Do not use street drugs.  Do not share needles.  Ask your health care provider for help if you need support or information about quitting drugs. Alcohol use  Do not drink alcohol if your health care provider tells you not to drink.  If you drink alcohol: ? Limit how much you have to 0-2 drinks a day. ? Be aware of how much alcohol is in your drink. In the [place] one drink equals one 12 oz bottle of beer (355 mL), one 5 oz glass of wine (148 mL), or one 1 oz glass of hard liquor (44 mL). General instructions  Schedule regular health, dental, and eye exams.  Stay current with your vaccines.  Tell your health care provider if: ? You often feel depressed. ? You have ever been abused or do not feel safe at home. Summary  Adopting a healthy lifestyle and getting preventive care are important in promoting health and wellness.  Follow your health care provider's instructions about healthy diet, exercising, and getting tested or screened for diseases.  Follow your health care provider's instructions on monitoring your cholesterol and blood pressure. This information is not intended to replace advice given to you by your health care provider. Make sure you discuss any questions you have with your health care provider. Document Revised: 10/14/2018 Document Reviewed: 10/14/2018 Elsevier Patient Education  2020 [place]. Shoulder Impingement Syndrome  Rehab Ask your health care provider which exercises are safe for you. Do exercises exactly as told by your health care provider and adjust them as directed. It is normal to feel mild stretching, pulling, tightness, or discomfort as you do these exercises. Stop right away if you feel sudden pain or your pain gets worse. Do not begin these exercises until told by your health care provider. Stretching and range-of-motion exercise This exercise warms up your muscles and joints and improves the movement and flexibility of your shoulder. This exercise also helps to relieve pain and stiffness. Passive horizontal adduction In passive adduction, you use your other hand to move the injured arm toward your body. The injured arm does not move on its own. In this movement, your arm is moved across your body in the horizontal plane (horizontal adduction). 1. Sit or stand and pull your left / right elbow across your chest, toward your other shoulder. Stop when you feel a gentle stretch in the back of your shoulder and upper arm. ? Keep your arm at shoulder height. ?  Keep your arm as close to your body as you comfortably can. 2. Hold for __________ seconds. 3. Slowly return to the starting position. Repeat __________ times. Complete this exercise __________ times a day. Strengthening exercises These exercises build strength and endurance in your shoulder. Endurance is the ability to use your muscles for a long time, even after they get tired. External rotation, isometric This is an exercise in which you press the back of your wrist against a door frame without moving your shoulder joint (isometric). 1. Stand or sit in a doorway, facing the door frame. 2. Bend your left / right elbow and place the back of your wrist against the door frame. Only the back of your wrist should be touching the frame. Keep your upper arm at your side. 3. Gently press your wrist against the door frame, as if you are trying to push your  arm away from your abdomen (external rotation). Press as hard as you are able without pain. ? Avoid shrugging your shoulder while you press your wrist against the door frame. Keep your shoulder blade tucked down toward the middle of your back. 4. Hold for __________ seconds. 5. Slowly release the tension, and relax your muscles completely before you repeat the exercise. Repeat __________ times. Complete this exercise __________ times a day. Internal rotation, isometric This is an exercise in which you press your palm against a door frame without moving your shoulder joint (isometric). 1. Stand or sit in a doorway, facing the door frame. 2. Bend your left / right elbow and place the palm of your hand against the door frame. Only your palm should be touching the frame. Keep your upper arm at your side. 3. Gently press your hand against the door frame, as if you are trying to push your arm toward your abdomen (internal rotation). Press as hard as you are able without pain. ? Avoid shrugging your shoulder while you press your hand against the door frame. Keep your shoulder blade tucked down toward the middle of your back. 4. Hold for __________ seconds. 5. Slowly release the tension, and relax your muscles completely before you repeat the exercise. Repeat __________ times. Complete this exercise __________ times a day. Scapular protraction, supine  1. Lie on your back on a firm surface (supine position). Hold a __________ weight in your left / right hand. 2. Raise your left / right arm straight into the air so your hand is directly above your shoulder joint. 3. Push the weight into the air so your shoulder (scapula) lifts off the surface that you are lying on. The scapula will push up or forward (protraction). Do not move your head, neck, or back. 4. Hold for __________ seconds. 5. Slowly return to the starting position. Let your muscles relax completely before you repeat this exercise. Repeat  __________ times. Complete this exercise __________ times a day. Scapular retraction  1. Sit in a stable chair without armrests, or stand up. 2. Secure an exercise band to a stable object in front of you so the band is at shoulder height. 3. Hold one end of the exercise band in each hand. Your palms should face down. 4. Squeeze your shoulder blades together (retraction) and move your elbows slightly behind you. Do not shrug your shoulders upward while you do this. 5. Hold for __________ seconds. 6. Slowly return to the starting position. Repeat __________ times. Complete this exercise __________ times a day. Shoulder extension  1. Sit in a stable chair without  armrests, or stand up. 2. Secure an exercise band to a stable object in front of you so the band is above shoulder height. 3. Hold one end of the exercise band in each hand. 4. Straighten your elbows and lift your hands up to shoulder height. 5. Squeeze your shoulder blades together and pull your hands down to the sides of your thighs (extension). Stop when your hands are straight down by your sides. Do not let your hands go behind your body. 6. Hold for __________ seconds. 7. Slowly return to the starting position. Repeat __________ times. Complete this exercise __________ times a day. This information is not intended to replace advice given to you by your health care provider. Make sure you discuss any questions you have with your health care provider. Document Revised: 02/12/2019 Document Reviewed: 11/16/2018 Elsevier Patient Education  [place].

## 2020-03-31 ENCOUNTER — Other Ambulatory Visit: Payer: Self-pay | Admitting: Physician Assistant

## 2020-03-31 DIAGNOSIS — E559 Vitamin D deficiency, unspecified: Secondary | ICD-10-CM

## 2020-03-31 DIAGNOSIS — E039 Hypothyroidism, unspecified: Secondary | ICD-10-CM

## 2020-03-31 MED ORDER — VITAMIN D (ERGOCALCIFEROL) 1.25 MG (50000 UNIT) PO CAPS
50000.0000 [IU] | ORAL_CAPSULE | ORAL | 0 refills | Status: DC
Start: 2020-03-31 — End: 2021-10-02

## 2020-03-31 MED ORDER — LEVOTHYROXINE SODIUM 75 MCG PO TABS: 75.0000 ug | ORAL_TABLET | Freq: Every day | ORAL | 3 refills | Status: DC

## 2020-03-31 NOTE — Progress Notes (Signed)
Call pt:  Thyroid looks great. Ok sent 1 year of refills.  Vitamin D is low. Start back on 50,000 units weekly recheck in 3 months.  Liver enzymes MUCH better.  No anemia.  B12 good.  Testosterone GREAT.  Cholesterol is elevated. Not to the point of medication at this point.  Sugars a little elevated. Will add a1c.   If fatigue not improving with restart of vitamin D then will consider sleep study.

## 2020-04-01 LAB — COMPLETE METABOLIC PANEL WITH GFR
AG Ratio: 2 (calc) (ref 1.0–2.5)
ALT: 91 U/L — ABNORMAL HIGH (ref 9–46)
AST: 34 U/L (ref 10–40)
Albumin: 4.7 g/dL (ref 3.6–5.1)
Alkaline phosphatase (APISO): 89 U/L (ref 36–130)
BUN: 16 mg/dL (ref 7–25)
CO2: 30 mmol/L (ref 20–32)
Calcium: 9.1 mg/dL (ref 8.6–10.3)
Chloride: 107 mmol/L (ref 98–110)
Creat: 0.95 mg/dL (ref 0.60–1.35)
GFR, Est African American: 119 mL/min/{1.73_m2} (ref >=60)
GFR, Est Non African American: 103 mL/min/{1.73_m2} (ref >=60)
Globulin: 2.4 g/dL (calc) (ref 1.9–3.7)
Glucose, Bld: 103 mg/dL — ABNORMAL HIGH (ref 65–99)
Potassium: 4.4 mmol/L (ref 3.5–5.3)
Sodium: 142 mmol/L (ref 135–146)
Total Bilirubin: 0.6 mg/dL (ref 0.2–1.2)
Total Protein: 7.1 g/dL (ref 6.1–8.1)

## 2020-04-01 LAB — LIPID PANEL W/REFLEX DIRECT LDL
Cholesterol: 212 mg/dL — ABNORMAL HIGH (ref <200)
HDL: 48 mg/dL (ref >=40)
LDL Cholesterol (Calc): 136 mg/dL (calc) — ABNORMAL HIGH
Non-HDL Cholesterol (Calc): 164 mg/dL (calc) — ABNORMAL HIGH (ref <130)
Total CHOL/HDL Ratio: 4.4 (calc) (ref <5.0)
Triglycerides: 152 mg/dL — ABNORMAL HIGH (ref <150)

## 2020-04-01 LAB — CBC
HCT: 43.2 % (ref 38.5–50.0)
Hemoglobin: 14.9 g/dL (ref 13.2–17.1)
MCH: 32 pg (ref 27.0–33.0)
MCHC: 34.5 g/dL (ref 32.0–36.0)
MCV: 92.9 fL (ref 80.0–100.0)
MPV: 10.5 fL (ref 7.5–12.5)
Platelets: 182 10*3/uL (ref 140–400)
RBC: 4.65 10*6/uL (ref 4.20–5.80)
RDW: 13 % (ref 11.0–15.0)
WBC: 4.4 10*3/uL (ref 3.8–10.8)

## 2020-04-01 LAB — VITAMIN D 25 HYDROXY (VIT D DEFICIENCY, FRACTURES): Vit D, 25-Hydroxy: 18 ng/mL — ABNORMAL LOW (ref 30–100)

## 2020-04-01 LAB — HEMOGLOBIN A1C W/OUT EAG: Hgb A1c MFr Bld: 5.1 % of total Hgb (ref <5.7)

## 2020-04-01 LAB — B12 AND FOLATE PANEL
Folate: 11.7 ng/mL
Vitamin B-12: 489 pg/mL (ref 200–1100)

## 2020-04-01 LAB — TESTOSTERONE: Testosterone: 663 ng/dL (ref 250–827)

## 2020-04-01 LAB — TSH: TSH: 2.33 mIU/L (ref 0.40–4.50)

## 2020-04-04 NOTE — Progress Notes (Signed)
A1C 5.1 normal range. No diabetes.

## 2020-10-31 DIAGNOSIS — Z20822 Contact with and (suspected) exposure to covid-19: Secondary | ICD-10-CM | POA: Diagnosis not present

## 2021-05-10 ENCOUNTER — Other Ambulatory Visit: Payer: Self-pay | Admitting: Physician Assistant

## 2021-05-10 DIAGNOSIS — E039 Hypothyroidism, unspecified: Secondary | ICD-10-CM

## 2021-05-14 ENCOUNTER — Other Ambulatory Visit: Payer: Self-pay | Admitting: Physician Assistant

## 2021-05-14 DIAGNOSIS — E039 Hypothyroidism, unspecified: Secondary | ICD-10-CM

## 2021-09-26 ENCOUNTER — Other Ambulatory Visit: Payer: Self-pay | Admitting: Physician Assistant

## 2021-09-26 DIAGNOSIS — E039 Hypothyroidism, unspecified: Secondary | ICD-10-CM

## 2021-10-01 ENCOUNTER — Other Ambulatory Visit: Payer: Self-pay

## 2021-10-01 DIAGNOSIS — E039 Hypothyroidism, unspecified: Secondary | ICD-10-CM

## 2021-10-01 MED ORDER — LEVOTHYROXINE SODIUM 75 MCG PO TABS: 75.0000 ug | ORAL_TABLET | Freq: Every day | ORAL | 0 refills | Status: DC

## 2021-10-02 ENCOUNTER — Ambulatory Visit: Payer: BC Managed Care – PPO | Admitting: Physician Assistant

## 2021-10-02 ENCOUNTER — Encounter: Payer: Self-pay | Admitting: Physician Assistant

## 2021-10-02 ENCOUNTER — Other Ambulatory Visit: Payer: Self-pay

## 2021-10-02 VITALS — BP 122/80 | HR 62 | Ht 66.0 in | Wt 224.0 lb

## 2021-10-02 DIAGNOSIS — Z131 Encounter for screening for diabetes mellitus: Secondary | ICD-10-CM

## 2021-10-02 DIAGNOSIS — R0683 Snoring: Secondary | ICD-10-CM | POA: Insufficient documentation

## 2021-10-02 DIAGNOSIS — R748 Abnormal levels of other serum enzymes: Secondary | ICD-10-CM

## 2021-10-02 DIAGNOSIS — Z13220 Encounter for screening for lipoid disorders: Secondary | ICD-10-CM

## 2021-10-02 DIAGNOSIS — R140 Abdominal distension (gaseous): Secondary | ICD-10-CM

## 2021-10-02 DIAGNOSIS — E6609 Other obesity due to excess calories: Secondary | ICD-10-CM

## 2021-10-02 DIAGNOSIS — E66812 Obesity, class 2: Secondary | ICD-10-CM

## 2021-10-02 DIAGNOSIS — Z6836 Body mass index (BMI) 36.0-36.9, adult: Secondary | ICD-10-CM

## 2021-10-02 DIAGNOSIS — G478 Other sleep disorders: Secondary | ICD-10-CM | POA: Diagnosis not present

## 2021-10-02 DIAGNOSIS — E039 Hypothyroidism, unspecified: Secondary | ICD-10-CM

## 2021-10-02 DIAGNOSIS — K582 Mixed irritable bowel syndrome: Secondary | ICD-10-CM | POA: Insufficient documentation

## 2021-10-02 MED ORDER — LEVOTHYROXINE SODIUM 75 MCG PO TABS
75.0000 ug | ORAL_TABLET | Freq: Every day | ORAL | 0 refills | Status: DC
Start: 2021-10-02 — End: 2021-11-06

## 2021-10-02 NOTE — Patient Instructions (Addendum)
Start probiotic daily(cultrelle)   Diet for Irritable Bowel Syndrome When you have irritable bowel syndrome (IBS), it is very important to eat the foods and follow the eating habits that are best for your condition. IBS may cause various symptoms such as pain in the abdomen, constipation, or diarrhea. Choosing the right foods can help to ease the discomfort from these symptoms. Work with your health care provider and diet and nutrition specialist (dietitian) to find the eating plan that will help to control your symptoms. What are tips for following this plan?   Keep a food diary. This will help you identify foods that cause symptoms. Write down: What you eat and when you eat it. What symptoms you have. When symptoms occur in relation to your meals, such as "pain in abdomen 2 hours after dinner." Eat your meals slowly and in a relaxed setting. Aim to eat 5-6 small meals per day. Do not skip meals. Drink enough fluid to keep your urine pale yellow. Ask your health care provider if you should take an over-the-counter probiotic to help restore healthy bacteria in your gut (digestive tract). Probiotics are foods that contain good bacteria and yeasts. Your dietitian may have specific dietary recommendations for you based on your symptoms. He or she may recommend that you: Avoid foods that cause symptoms. Talk with your dietitian about other ways to get the same nutrients that are in those problem foods. Avoid foods with gluten. Gluten is a protein that is found in rye, wheat, and barley. Eat more foods that contain soluble fiber. Examples of foods with high soluble fiber include oats, seeds, and certain fruits and vegetables. Take a fiber supplement if directed by your dietitian. Reduce or avoid certain foods called FODMAPs. These are foods that contain carbohydrates that are hard to digest. Ask your doctor which foods contain these carbohydrates. What foods are not recommended? The following are  some foods and drinks that may make your symptoms worse: Fatty foods, such as french fries. Foods that contain gluten, such as pasta and cereal. Dairy products, such as milk, cheese, and ice cream. Chocolate. Alcohol. Products with caffeine, such as coffee. Carbonated drinks, such as soda. Foods that are high in FODMAPs. These include certain fruits and vegetables. Products with sweeteners such as honey, high fructose corn syrup, sorbitol, and mannitol. The items listed above may not be a complete list of foods and beverages you should avoid. Contact a dietitian for more information. What foods are good sources of fiber? Your health care provider or dietitian may recommend that you eat more foods that contain fiber. Fiber can help to reduce constipation and other IBS symptoms. Add foods with fiber to your diet a little at a time so your body can get used to them. Too much fiber at one time might cause gas and swelling of your abdomen. The following are some foods that are good sources of fiber: Berries, such as raspberries, strawberries, and blueberries. Tomatoes. Carrots. Brown rice. Oats. Seeds, such as chia and pumpkin seeds. The items listed above may not be a complete list of recommended sources of fiber. Contact your dietitian for more options. Where to find more information International [employer] for Functional Gastrointestinal Disorders: www.iffgd.[employer] Diabetes and Digestive and Kidney Diseases: [employer] Summary When you have irritable bowel syndrome (IBS), it is very important to eat the foods and follow the eating habits that are best for your condition. IBS may cause various symptoms such as pain in the abdomen, constipation,  or diarrhea. Choosing the right foods can help to ease the discomfort that comes from symptoms. Keep a food diary. This will help you identify foods that cause symptoms. Your health care provider or diet and nutrition  specialist (dietitian) may recommend that you eat more foods that contain fiber. This information is not intended to replace advice given to you by your health care provider. Make sure you discuss any questions you have with your health care provider. Document Revised: 06/15/2020 Document Reviewed: 06/22/2020 Elsevier Patient Education  [place] Maintenance, Male Adopting a healthy lifestyle and getting preventive care are important in promoting health and wellness. Ask your health care provider about: The right schedule for you to have regular tests and exams. Things you can do on your own to prevent diseases and keep yourself healthy. What should I know about diet, weight, and exercise? Eat a healthy diet  Eat a diet that includes plenty of vegetables, fruits, low-fat dairy products, and lean protein. Do not eat a lot of foods that are high in solid fats, added sugars, or sodium. Maintain a healthy weight Body mass index (BMI) is a measurement that can be used to identify possible weight problems. It estimates body fat based on height and weight. Your health care provider can help determine your BMI and help you achieve or maintain a healthy weight. Get regular exercise Get regular exercise. This is one of the most important things you can do for your health. Most adults should: Exercise for at least 150 minutes each week. The exercise should increase your heart rate and make you sweat (moderate-intensity exercise). Do strengthening exercises at least twice a week. This is in addition to the moderate-intensity exercise. Spend less time sitting. Even light physical activity can be beneficial. Watch cholesterol and blood lipids Have your blood tested for lipids and cholesterol at 38 years of age, then have this test every 5 years. You may need to have your cholesterol levels checked more often if: Your lipid or cholesterol levels are high. You are older than 38 years of  age. You are at high risk for heart disease. What should I know about cancer screening? Many types of cancers can be detected early and may often be prevented. Depending on your health history and family history, you may need to have cancer screening at various ages. This may include screening for: Colorectal cancer. Prostate cancer. Skin cancer. Lung cancer. What should I know about heart disease, diabetes, and high blood pressure? Blood pressure and heart disease High blood pressure causes heart disease and increases the risk of stroke. This is more likely to develop in people who have high blood pressure readings or are overweight. Talk with your health care provider about your target blood pressure readings. Have your blood pressure checked: Every 3-5 years if you are 44-58 years of age. Every year if you are 77 years old or older. If you are between the ages of 84 and 35 and are a current or former smoker, ask your health care provider if you should have a one-time screening for abdominal aortic aneurysm (AAA). Diabetes Have regular diabetes screenings. This checks your fasting blood sugar level. Have the screening done: Once every three years after age 56 if you are at a normal weight and have a low risk for diabetes. More often and at a younger age if you are overweight or have a high risk for diabetes. What should I know about preventing infection? Hepatitis B  If you have a higher risk for hepatitis B, you should be screened for this virus. Talk with your health care provider to find out if you are at risk for hepatitis B infection. Hepatitis C Blood testing is recommended for: Everyone born from [birth year] through 1965. Anyone with known risk factors for hepatitis C. Sexually transmitted infections (STIs) You should be screened each year for STIs, including gonorrhea and chlamydia, if: You are sexually active and are younger than 38 years of age. You are older than 38 years of age  and your health care provider tells you that you are at risk for this type of infection. Your sexual activity has changed since you were last screened, and you are at increased risk for chlamydia or gonorrhea. Ask your health care provider if you are at risk. Ask your health care provider about whether you are at high risk for HIV. Your health care provider may recommend a prescription medicine to help prevent HIV infection. If you choose to take medicine to prevent HIV, you should first get tested for HIV. You should then be tested every 3 months for as long as you are taking the medicine. Follow these instructions at home: Alcohol use Do not drink alcohol if your health care provider tells you not to drink. If you drink alcohol: Limit how much you have to 0-2 drinks a day. Know how much alcohol is in your drink. In the [place] one drink equals one 12 oz bottle of beer (355 mL), one 5 oz glass of wine (148 mL), or one 1 oz glass of hard liquor (44 mL). Lifestyle Do not use any products that contain nicotine or tobacco. These products include cigarettes, chewing tobacco, and vaping devices, such as e-cigarettes. If you need help quitting, ask your health care provider. Do not use street drugs. Do not share needles. Ask your health care provider for help if you need support or information about quitting drugs. General instructions Schedule regular health, dental, and eye exams. Stay current with your vaccines. Tell your health care provider if: You often feel depressed. You have ever been abused or do not feel safe at home. Summary Adopting a healthy lifestyle and getting preventive care are important in promoting health and wellness. Follow your health care provider's instructions about healthy diet, exercising, and getting tested or screened for diseases. Follow your health care provider's instructions on monitoring your cholesterol and blood pressure. This information is not intended to  replace advice given to you by your health care provider. Make sure you discuss any questions you have with your health care provider. Document Revised: 03/12/2021 Document Reviewed: 03/12/2021 Elsevier Patient Education  [place].

## 2021-10-02 NOTE — Progress Notes (Signed)
Subjective:    Patient ID: Tyrone Johnson, male    DOB: 1981/10/27, 40 y.o.   MRN: [id number]  HPI Patient is a 40 year old male with hypothyroidism and dyslipidemia who presents to the clinic for follow-up and medication refills  Patient admits he has not been taking his thyroid medicine for the last week or more.  He just ran out and he needed an appointment. No concerns with medication.   He does have some ongoing bloating and intermittent diarrhea/constipation symptoms. Unsure of what would help.   His wife if concerned with his snoring. He does endorse not feeling rested in the morning.    .. 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   Right-sided headache 02/28/2016   Screening for diabetes mellitus 02/28/2016   Thyroid activity decreased 02/28/2016   Elevated LDL cholesterol level 02/28/2016   No energy 08/05/2016   Bloating 11/27/2018   Vitamin D deficiency 11/30/2018   Acute pain of right shoulder 03/29/2020   Non-restorative sleep 10/02/2021   Class 2 obesity due to excess calories without serious comorbidity with body mass index (BMI) of 36.0 to 36.9 in adult 10/02/2021   Snoring 10/02/2021   Irritable bowel syndrome with both constipation and diarrhea 10/02/2021   Resolved Ambulatory Problems    Diagnosis Date Noted   Acute maxillary sinusitis 11/01/2009   Allergic sinusitis 08/05/2016   Past Medical History:  Diagnosis Date   Myalgia    Thyroid disease      Review of Systems See HPI.     Objective:   Physical Exam Vitals reviewed.  Constitutional:      Appearance: Normal appearance. He is obese.  HENT:     Head:  Normocephalic.  Neck:     Vascular: No carotid bruit.  Cardiovascular:     Rate and Rhythm: Normal rate and regular rhythm.     Pulses: Normal pulses.     Heart sounds: Normal heart sounds.  Pulmonary:     Breath sounds: Normal breath sounds.  Abdominal:     General: Bowel sounds are normal. There is no distension.     Palpations: Abdomen is soft. There is no mass.     Tenderness: There is no abdominal tenderness. There is no right CVA tenderness, left CVA tenderness, guarding or rebound.     Hernia: No hernia is present.  Musculoskeletal:     Cervical back: Neck supple.     Right lower leg: No edema.     Left lower leg: No edema.  Lymphadenopathy:     Cervical: No cervical adenopathy.  Neurological:     General: No focal deficit present.     Mental Status: He is alert.  Psychiatric:        Mood and Affect: Mood normal.          Assessment & Plan:  Marland KitchenMarland KitchenLopez was seen today for follow-up.  Diagnoses and all orders for this visit:  Acquired hypothyroidism -     COMPLETE METABOLIC PANEL WITH GFR -     TSH -     CBC with Differential/Platelet  Hypothyroidism, unspecified type -     levothyroxine (SYNTHROID) 75 MCG tablet; Take 1 tablet (75 mcg total) by  mouth daily before breakfast. NEEDS APPT FOR REFILLS  Screening for diabetes mellitus -     COMPLETE METABOLIC PANEL WITH GFR  Screening for lipid disorders -     Lipid Panel w/reflex Direct LDL  Non-restorative sleep -     Home sleep test  Snoring -     Home sleep test  Class 2 obesity due to excess calories without serious comorbidity with body mass index (BMI) of 36.0 to 36.9 in adult -     Home sleep test   Need to check labs.  Pt out of levothyroxine for over a week.  Restart levothyroxine and recheck labs in 4 weeks.  Pt high risk for sleep apnea. StOP BANG greater than 3.  Home sleep test ordered for screening.  Discussed IbS symptoms and diet.  Start with diet and probiotic.  Follow up in 6 months.

## 2021-10-03 ENCOUNTER — Other Ambulatory Visit: Payer: Self-pay

## 2021-10-03 DIAGNOSIS — E6609 Other obesity due to excess calories: Secondary | ICD-10-CM

## 2021-10-03 DIAGNOSIS — R0683 Snoring: Secondary | ICD-10-CM

## 2021-10-03 DIAGNOSIS — G478 Other sleep disorders: Secondary | ICD-10-CM

## 2021-10-03 NOTE — Progress Notes (Signed)
Referral for home sleep test re-ordered as requested by referral coordinator.

## 2021-11-02 DIAGNOSIS — E039 Hypothyroidism, unspecified: Secondary | ICD-10-CM | POA: Diagnosis not present

## 2021-11-02 DIAGNOSIS — Z131 Encounter for screening for diabetes mellitus: Secondary | ICD-10-CM | POA: Diagnosis not present

## 2021-11-02 DIAGNOSIS — Z13220 Encounter for screening for lipoid disorders: Secondary | ICD-10-CM | POA: Diagnosis not present

## 2021-11-03 LAB — COMPLETE METABOLIC PANEL WITH GFR
AG Ratio: 1.9 (calc) (ref 1.0–2.5)
ALT: 112 U/L — ABNORMAL HIGH (ref 9–46)
AST: 45 U/L — ABNORMAL HIGH (ref 10–40)
Albumin: 4.5 g/dL (ref 3.6–5.1)
Alkaline phosphatase (APISO): 93 U/L (ref 36–130)
BUN: 13 mg/dL (ref 7–25)
CO2: 27 mmol/L (ref 20–32)
Calcium: 9.2 mg/dL (ref 8.6–10.3)
Chloride: 105 mmol/L (ref 98–110)
Creat: 0.87 mg/dL (ref 0.60–1.26)
Globulin: 2.4 g/dL (calc) (ref 1.9–3.7)
Glucose, Bld: 94 mg/dL (ref 65–99)
Potassium: 4.2 mmol/L (ref 3.5–5.3)
Sodium: 142 mmol/L (ref 135–146)
Total Bilirubin: 0.4 mg/dL (ref 0.2–1.2)
Total Protein: 6.9 g/dL (ref 6.1–8.1)
eGFR: 113 mL/min/{1.73_m2} (ref >=60)

## 2021-11-03 LAB — CBC WITH DIFFERENTIAL/PLATELET
Absolute Monocytes: 230 cells/uL (ref 200–950)
Basophils Absolute: 31 cells/uL (ref 0–200)
Basophils Relative: 0.8 %
Eosinophils Absolute: 109 cells/uL (ref 15–500)
Eosinophils Relative: 2.8 %
HCT: 41.8 % (ref 38.5–50.0)
Hemoglobin: 14.5 g/dL (ref 13.2–17.1)
Lymphs Abs: 1634 cells/uL (ref 850–3900)
MCH: 32 pg (ref 27.0–33.0)
MCHC: 34.7 g/dL (ref 32.0–36.0)
MCV: 92.3 fL (ref 80.0–100.0)
MPV: 9.7 fL (ref 7.5–12.5)
Monocytes Relative: 5.9 %
Neutro Abs: 1895 cells/uL (ref 1500–7800)
Neutrophils Relative %: 48.6 %
Platelets: 221 10*3/uL (ref 140–400)
RBC: 4.53 10*6/uL (ref 4.20–5.80)
RDW: 12.7 % (ref 11.0–15.0)
Total Lymphocyte: 41.9 %
WBC: 3.9 10*3/uL (ref 3.8–10.8)

## 2021-11-03 LAB — LIPID PANEL W/REFLEX DIRECT LDL
Cholesterol: 191 mg/dL (ref <200)
HDL: 42 mg/dL (ref >=40)
LDL Cholesterol (Calc): 124 mg/dL (calc) — ABNORMAL HIGH
Non-HDL Cholesterol (Calc): 149 mg/dL (calc) — ABNORMAL HIGH (ref <130)
Total CHOL/HDL Ratio: 4.5 (calc) (ref <5.0)
Triglycerides: 137 mg/dL (ref <150)

## 2021-11-03 LAB — TSH: TSH: 5.02 mIU/L — ABNORMAL HIGH (ref 0.40–4.50)

## 2021-11-06 ENCOUNTER — Other Ambulatory Visit: Payer: Self-pay | Admitting: Physician Assistant

## 2021-11-06 MED ORDER — LEVOTHYROXINE SODIUM 88 MCG PO TABS: 88.0000 ug | ORAL_TABLET | Freq: Every day | ORAL | 0 refills | Status: DC

## 2021-11-06 NOTE — Progress Notes (Signed)
TSH elevated. Increased levothyroxine and recheck in 3 months.  Cholesterol is better than 1 year ago but still not to goal.  Your liver enzymes are up some. Are you drinking any alcohol? Taking any tylenol? You do have fatty liver disease and can cause the elevated liver enzymes as well as thyroid being off. Exercise regularly continue to keep low fat diet. Avoid alcohol and tylenol. Recheck liver enzymes with thyroid in 3 months.

## 2021-11-07 NOTE — Addendum Note (Signed)
Addended by: Fonnie Mu on: 11/07/2021 11:23 AM   Modules accepted: Orders

## 2021-11-21 ENCOUNTER — Encounter (HOSPITAL_BASED_OUTPATIENT_CLINIC_OR_DEPARTMENT_OTHER): Payer: BC Managed Care – PPO | Admitting: Internal Medicine

## 2022-01-29 DIAGNOSIS — R748 Abnormal levels of other serum enzymes: Secondary | ICD-10-CM | POA: Diagnosis not present

## 2022-01-29 DIAGNOSIS — E039 Hypothyroidism, unspecified: Secondary | ICD-10-CM | POA: Diagnosis not present

## 2022-01-30 ENCOUNTER — Other Ambulatory Visit: Payer: Self-pay | Admitting: Physician Assistant

## 2022-01-30 LAB — HEPATIC FUNCTION PANEL
AG Ratio: 1.6 (calc) (ref 1.0–2.5)
ALT: 106 U/L — ABNORMAL HIGH (ref 9–46)
AST: 39 U/L (ref 10–40)
Albumin: 4.6 g/dL (ref 3.6–5.1)
Alkaline phosphatase (APISO): 114 U/L (ref 36–130)
Bilirubin, Direct: 0.1 mg/dL (ref 0.0–0.2)
Globulin: 2.9 g/dL (calc) (ref 1.9–3.7)
Indirect Bilirubin: 0.5 mg/dL (calc) (ref 0.2–1.2)
Total Bilirubin: 0.6 mg/dL (ref 0.2–1.2)
Total Protein: 7.5 g/dL (ref 6.1–8.1)

## 2022-01-30 LAB — TSH: TSH: 1.9 mIU/L (ref 0.40–4.50)

## 2022-01-30 MED ORDER — LEVOTHYROXINE SODIUM 88 MCG PO TABS: 88.0000 ug | ORAL_TABLET | Freq: Every day | ORAL | 3 refills | Status: DC

## 2022-10-04 ENCOUNTER — Encounter: Payer: BC Managed Care – PPO | Admitting: Physician Assistant

## 2022-10-23 ENCOUNTER — Encounter: Payer: Self-pay | Admitting: Physician Assistant

## 2022-10-23 ENCOUNTER — Ambulatory Visit (INDEPENDENT_AMBULATORY_CARE_PROVIDER_SITE_OTHER): Payer: BC Managed Care – PPO | Admitting: Physician Assistant

## 2022-10-23 VITALS — BP 111/73 | HR 75 | Ht 63.0 in | Wt 233.1 lb

## 2022-10-23 DIAGNOSIS — Z13220 Encounter for screening for lipoid disorders: Secondary | ICD-10-CM | POA: Diagnosis not present

## 2022-10-23 DIAGNOSIS — Z131 Encounter for screening for diabetes mellitus: Secondary | ICD-10-CM

## 2022-10-23 DIAGNOSIS — Z1329 Encounter for screening for other suspected endocrine disorder: Secondary | ICD-10-CM | POA: Diagnosis not present

## 2022-10-23 DIAGNOSIS — Z0000 Encounter for general adult medical examination without abnormal findings: Secondary | ICD-10-CM

## 2022-10-23 DIAGNOSIS — R748 Abnormal levels of other serum enzymes: Secondary | ICD-10-CM | POA: Diagnosis not present

## 2022-10-23 NOTE — Patient Instructions (Addendum)
Culturelle probiotic for gut Health Maintenance, Male Adopting a healthy lifestyle and getting preventive care are important in promoting health and wellness. Ask your health care provider about: The right schedule for you to have regular tests and exams. Things you can do on your own to prevent diseases and keep yourself healthy. What should I know about diet, weight, and exercise? Eat a healthy diet  Eat a diet that includes plenty of vegetables, fruits, low-fat dairy products, and lean protein. Do not eat a lot of foods that are high in solid fats, added sugars, or sodium. Maintain a healthy weight Body mass index (BMI) is a measurement that can be used to identify possible weight problems. It estimates body fat based on height and weight. Your health care provider can help determine your BMI and help you achieve or maintain a healthy weight. Get regular exercise Get regular exercise. This is one of the most important things you can do for your health. Most adults should: Exercise for at least 150 minutes each week. The exercise should increase your heart rate and make you sweat (moderate-intensity exercise). Do strengthening exercises at least twice a week. This is in addition to the moderate-intensity exercise. Spend less time sitting. Even light physical activity can be beneficial. Watch cholesterol and blood lipids Have your blood tested for lipids and cholesterol at 39 years of age, then have this test every 5 years. You may need to have your cholesterol levels checked more often if: Your lipid or cholesterol levels are high. You are older than 39 years of age. You are at high risk for heart disease. What should I know about cancer screening? Many types of cancers can be detected early and may often be prevented. Depending on your health history and family history, you may need to have cancer screening at various ages. This may include screening for: Colorectal cancer. Prostate  cancer. Skin cancer. Lung cancer. What should I know about heart disease, diabetes, and high blood pressure? Blood pressure and heart disease High blood pressure causes heart disease and increases the risk of stroke. This is more likely to develop in people who have high blood pressure readings or are overweight. Talk with your health care provider about your target blood pressure readings. Have your blood pressure checked: Every 3-5 years if you are 39-29 years of age. Every year if you are 39 years old or older. If you are between the ages of 61 and 56 and are a current or former smoker, ask your health care provider if you should have a one-time screening for abdominal aortic aneurysm (AAA). Diabetes Have regular diabetes screenings. This checks your fasting blood sugar level. Have the screening done: Once every three years after age 39 if you are at a normal weight and have a low risk for diabetes. More often and at a younger age if you are overweight or have a high risk for diabetes. What should I know about preventing infection? Hepatitis B If you have a higher risk for hepatitis B, you should be screened for this virus. Talk with your health care provider to find out if you are at risk for hepatitis B infection. Hepatitis C Blood testing is recommended for: Everyone born from [birth year] through 1965. Anyone with known risk factors for hepatitis C. Sexually transmitted infections (STIs) You should be screened each year for STIs, including gonorrhea and chlamydia, if: You are sexually active and are younger than 39 years of age. You are older than 39 years  of age and your health care provider tells you that you are at risk for this type of infection. Your sexual activity has changed since you were last screened, and you are at increased risk for chlamydia or gonorrhea. Ask your health care provider if you are at risk. Ask your health care provider about whether you are at high risk for HIV.  Your health care provider may recommend a prescription medicine to help prevent HIV infection. If you choose to take medicine to prevent HIV, you should first get tested for HIV. You should then be tested every 3 months for as long as you are taking the medicine. Follow these instructions at home: Alcohol use Do not drink alcohol if your health care provider tells you not to drink. If you drink alcohol: Limit how much you have to 0-2 drinks a day. Know how much alcohol is in your drink. In the [place] one drink equals one 12 oz bottle of beer (355 mL), one 5 oz glass of wine (148 mL), or one 1 oz glass of hard liquor (44 mL). Lifestyle Do not use any products that contain nicotine or tobacco. These products include cigarettes, chewing tobacco, and vaping devices, such as e-cigarettes. If you need help quitting, ask your health care provider. Do not use street drugs. Do not share needles. Ask your health care provider for help if you need support or information about quitting drugs. General instructions Schedule regular health, dental, and eye exams. Stay current with your vaccines. Tell your health care provider if: You often feel depressed. You have ever been abused or do not feel safe at home. Summary Adopting a healthy lifestyle and getting preventive care are important in promoting health and wellness. Follow your health care provider's instructions about healthy diet, exercising, and getting tested or screened for diseases. Follow your health care provider's instructions on monitoring your cholesterol and blood pressure. This information is not intended to replace advice given to you by your health care provider. Make sure you discuss any questions you have with your health care provider. Document Revised: 03/12/2021 Document Reviewed: 03/12/2021 Elsevier Patient Education  [place].

## 2022-10-23 NOTE — Progress Notes (Signed)
Complete physical exam  Patient: Tyrone Johnson   DOB: 1981/01/14   41 y.o. Male  MRN: [id number]  Subjective:    No chief complaint on file.   Tyrone Johnson is a 41 y.o. male who presents today for a complete physical exam. He reports consuming a general diet. The patient does not participate in regular exercise at present. He generally feels well. He reports sleeping well. He does not have additional problems to discuss today.    Most recent fall risk assessment:    03/29/2020    8:39 AM  Fall Risk   Falls in the past year? 0  Number falls in past yr: 0  Injury with Fall? 0  Follow up Falls evaluation completed     Most recent depression screenings:    03/29/2020    8:39 AM 11/27/2018   10:25 AM  PHQ 2/9 Scores  PHQ - 2 Score 0 0  PHQ- 9 Score 1     Vision:Within last year and Dental: No current dental problems and Receives regular dental care  Patient Active Problem List   Diagnosis Date Noted   Non-restorative sleep 10/02/2021   Class 2 obesity due to excess calories without serious comorbidity with body mass index (BMI) of 36.0 to 36.9 in adult 10/02/2021   Snoring 10/02/2021   Irritable bowel syndrome with both constipation and diarrhea 10/02/2021   Acute pain of right shoulder 03/29/2020   Vitamin D deficiency 11/30/2018   Bloating 11/27/2018   No energy 08/05/2016   Chronic right maxillary sinusitis 02/28/2016   Right-sided headache 02/28/2016   Screening for diabetes mellitus 02/28/2016   Thyroid activity decreased 02/28/2016   Elevated LDL cholesterol level 02/28/2016   Sleeping difficulty 08/11/2013   Fatigue 08/11/2013   CONSTIPATION 11/06/2009   FATTY LIVER DISEASE 04/28/2009   OTHER NONSPECIFIC ABNORMAL SERUM ENZYME LEVELS 04/24/2009   SACROILIITIS, RIGHT 08/01/2008   DEPRESSION 06/27/2008   UNSPECIFIED HYPOTHYROIDISM 05/30/2008   Past Medical History:  Diagnosis Date   Myalgia    Thyroid disease    History reviewed. No  pertinent surgical history. Family History  Problem Relation Age of Onset   Diabetes Mother    Ulcers Father    Eczema Son    Allergies  Allergen Reactions   Duloxetine     REACTION: palpitations      Patient Care Team: Lavada Mesi as PCP - General (Family Medicine)   Outpatient Medications Prior to Visit  Medication Sig   levothyroxine (SYNTHROID) 88 MCG tablet Take 1 tablet (88 mcg total) by mouth daily.   No facility-administered medications prior to visit.    Review of Systems  All other systems reviewed and are negative.         Objective:     There were no vitals taken for this visit. BP Readings from Last 3 Encounters:  10/23/22 111/73  10/02/21 122/80  03/29/20 136/84   Wt Readings from Last 3 Encounters:  10/23/22 233 lb 1.3 oz (105.7 kg)  10/02/21 224 lb (101.6 kg)  03/29/20 216 lb (98 kg)      Physical Exam  BP 111/73 (BP Location: Left Arm, Patient Position: Sitting, Cuff Size: Large)   Pulse 75   Ht '5\' 3"'$  (1.6 m)   Wt 233 lb 1.3 oz (105.7 kg)   SpO2 95%   BMI 41.29 kg/m   General Appearance:    Alert, cooperative, no distress, appears stated age  Head:    Normocephalic, without obvious  abnormality, atraumatic  Eyes:    PERRL, conjunctiva/corneas clear, EOM's intact, fundi    benign, both eyes       Ears:    Normal TM's and external ear canals, both ears  Nose:   Nares normal, septum midline, mucosa normal, no drainage    or sinus tenderness  Throat:   Lips, mucosa, and tongue normal; teeth and gums normal  Neck:   Supple, symmetrical, trachea midline, no adenopathy;       thyroid:  No enlargement/tenderness/nodules; no carotid   bruit or JVD  Back:     Symmetric, no curvature, ROM normal, no CVA tenderness  Lungs:     Clear to auscultation bilaterally, respirations unlabored  Chest wall:    No tenderness or deformity  Heart:    Regular rate and rhythm, S1 and S2 normal, no murmur, rub   or gallop  Abdomen:     Soft,  non-tender, bowel sounds active all four quadrants,    no masses, no organomegaly        Extremities:   Extremities normal, atraumatic, no cyanosis or edema  Pulses:   2+ and symmetric all extremities  Skin:   Skin color, texture, turgor normal, no rashes or lesions  Lymph nodes:   Cervical, supraclavicular, and axillary nodes normal  Neurologic:   CNII-XII intact. Normal strength, sensation and reflexes      throughout      Assessment & Plan:    Routine Health Maintenance and Physical Exam  Immunization History  Administered Date(s) Administered   Influenza-Unspecified 07/05/2016   Janssen (J&J) SARS-COV-2 Vaccination 02/05/2020   Td 06/01/2009   Tdap 03/29/2020    Health Maintenance  Topic Date Due   INFLUENZA VACCINE  06/04/2022   COVID-19 Vaccine (2 - 2023-24 season) 07/05/2022   DTaP/Tdap/Td (3 - Td or Tdap) 03/29/2030   Hepatitis C Screening  Completed   HIV Screening  Completed   HPV VACCINES  Aged Out    Discussed health benefits of physical activity, and encouraged him to engage in regular exercise appropriate for his age and condition.  Tyrone KitchenEffie Johnson was seen today for annual exam.  Diagnoses and all orders for this visit:  Routine physical examination -     Lipid Panel w/reflex Direct LDL -     TSH -     COMPLETE METABOLIC PANEL WITH GFR  Screening for diabetes mellitus -     COMPLETE METABOLIC PANEL WITH GFR  Thyroid disorder screen -     TSH  Screening for lipid disorders -     Lipid Panel w/reflex Direct LDL   .Tyrone KitchenStart a regular exercise program and make sure you are eating a healthy diet Try to eat 4 servings of dairy a day or take a calcium supplement ('500mg'$  twice a day). Declines flu/covid vaccine. Fasting labs ordered Will adjust levothyroxine accordingly Culturelle for bloating     Tyrone Planas, PA-C

## 2022-10-25 ENCOUNTER — Other Ambulatory Visit: Payer: Self-pay | Admitting: Family Medicine

## 2022-10-25 DIAGNOSIS — K760 Fatty (change of) liver, not elsewhere classified: Secondary | ICD-10-CM

## 2022-10-25 DIAGNOSIS — R748 Abnormal levels of other serum enzymes: Secondary | ICD-10-CM

## 2022-10-25 LAB — COMPLETE METABOLIC PANEL WITH GFR
AG Ratio: 1.6 (calc) (ref 1.0–2.5)
ALT: 190 U/L — ABNORMAL HIGH (ref 9–46)
AST: 62 U/L — ABNORMAL HIGH (ref 10–40)
Albumin: 4.7 g/dL (ref 3.6–5.1)
Alkaline phosphatase (APISO): 121 U/L (ref 36–130)
BUN: 13 mg/dL (ref 7–25)
CO2: 28 mmol/L (ref 20–32)
Calcium: 9.7 mg/dL (ref 8.6–10.3)
Chloride: 106 mmol/L (ref 98–110)
Creat: 0.84 mg/dL (ref 0.60–1.26)
Globulin: 2.9 g/dL (calc) (ref 1.9–3.7)
Glucose, Bld: 115 mg/dL — ABNORMAL HIGH (ref 65–99)
Potassium: 4.6 mmol/L (ref 3.5–5.3)
Sodium: 142 mmol/L (ref 135–146)
Total Bilirubin: 0.5 mg/dL (ref 0.2–1.2)
Total Protein: 7.6 g/dL (ref 6.1–8.1)
eGFR: 114 mL/min/{1.73_m2} (ref >=60)

## 2022-10-25 LAB — HEPATITIS PANEL, ACUTE
Hep A IgM: NONREACTIVE
Hep B C IgM: NONREACTIVE
Hepatitis B Surface Ag: NONREACTIVE
Hepatitis C Ab: NONREACTIVE

## 2022-10-25 LAB — LIPID PANEL W/REFLEX DIRECT LDL
Cholesterol: 232 mg/dL — ABNORMAL HIGH (ref <200)
HDL: 61 mg/dL (ref >=40)
LDL Cholesterol (Calc): 140 mg/dL (calc) — ABNORMAL HIGH
Non-HDL Cholesterol (Calc): 171 mg/dL (calc) — ABNORMAL HIGH (ref <130)
Total CHOL/HDL Ratio: 3.8 (calc) (ref <5.0)
Triglycerides: 171 mg/dL — ABNORMAL HIGH (ref <150)

## 2022-10-25 LAB — TSH: TSH: 2.23 mIU/L (ref 0.40–4.50)

## 2022-10-25 LAB — TEST AUTHORIZATION

## 2022-10-25 NOTE — Progress Notes (Signed)
Hi Tyrone Johnson, your liver enzymes have increased compared to 8 months ago.  Even higher than they were a most a year ago.  We need to get an updated ultrasound of your liver to see what is going on.  If you are okay with scheduling that please let us know.  We can usually schedule it here in our building but if you do need a different location then please let us know.  Cholesterol and LDL are also significantly elevated higher than last year.  Just really encouraged her to work on healthy diet and regular exercise to improve those numbers.  Please cut back on fried and greasy/oily foods.  Thyroid looks great.   Please call the lab and add an acute hepatitis panel.

## 2022-10-25 NOTE — Progress Notes (Signed)
Orders Placed This Encounter     Lipid Panel w/reflex Direct LDL     TSH     COMPLETE METABOLIC PANEL WITH GFR     Hepatitis panel, acute     TEST AUTHORIZATION

## 2022-10-29 ENCOUNTER — Ambulatory Visit (INDEPENDENT_AMBULATORY_CARE_PROVIDER_SITE_OTHER): Payer: BC Managed Care – PPO

## 2022-10-29 DIAGNOSIS — K828 Other specified diseases of gallbladder: Secondary | ICD-10-CM | POA: Diagnosis not present

## 2022-10-29 DIAGNOSIS — R748 Abnormal levels of other serum enzymes: Secondary | ICD-10-CM

## 2022-10-29 DIAGNOSIS — K760 Fatty (change of) liver, not elsewhere classified: Secondary | ICD-10-CM

## 2022-10-29 NOTE — Progress Notes (Signed)
Hi Tyrone Johnson, your ultrasound shows fatty infiltration in the liver with no mass or nodules which is reassuring.  I would recommend rechecking your liver function in 2 to 3 weeks.  Make sure avoiding any excess Tylenol or alcohol products.  If they are trending back down to your baseline then we will continue to monitor.  But if they are staying elevated then we need to get you in with a gastroenterologist.  You are also noted to have a few polyps in your gallbladder they were all very tiny so not in a range that needs further workup.  They should not be causing a problem at this size.

## 2022-10-30 NOTE — Progress Notes (Signed)
Labs ordered.

## 2022-11-06 ENCOUNTER — Other Ambulatory Visit: Payer: BC Managed Care – PPO

## 2022-11-21 ENCOUNTER — Ambulatory Visit: Payer: BC Managed Care – PPO

## 2022-11-21 ENCOUNTER — Other Ambulatory Visit: Payer: Self-pay | Admitting: *Deleted

## 2022-11-21 DIAGNOSIS — R748 Abnormal levels of other serum enzymes: Secondary | ICD-10-CM

## 2022-11-21 DIAGNOSIS — K760 Fatty (change of) liver, not elsewhere classified: Secondary | ICD-10-CM | POA: Diagnosis not present

## 2022-11-21 NOTE — Progress Notes (Signed)
Labs updated to [facility].

## 2022-11-26 LAB — CBC WITH DIFFERENTIAL/PLATELET
Absolute Monocytes: 308 cells/uL (ref 200–950)
Basophils Absolute: 28 cells/uL (ref 0–200)
Basophils Relative: 0.5 %
Eosinophils Absolute: 50 cells/uL (ref 15–500)
Eosinophils Relative: 0.9 %
HCT: 45.7 % (ref 38.5–50.0)
Hemoglobin: 16 g/dL (ref 13.2–17.1)
Lymphs Abs: 1932 cells/uL (ref 850–3900)
MCH: 32.7 pg (ref 27.0–33.0)
MCHC: 35 g/dL (ref 32.0–36.0)
MCV: 93.3 fL (ref 80.0–100.0)
MPV: 10.3 fL (ref 7.5–12.5)
Monocytes Relative: 5.5 %
Neutro Abs: 3282 cells/uL (ref 1500–7800)
Neutrophils Relative %: 58.6 %
Platelets: 199 10*3/uL (ref 140–400)
RBC: 4.9 10*6/uL (ref 4.20–5.80)
RDW: 13.2 % (ref 11.0–15.0)
Total Lymphocyte: 34.5 %
WBC: 5.6 10*3/uL (ref 3.8–10.8)

## 2022-11-26 LAB — ALPHA-1-ANTITRYPSIN: A-1 Antitrypsin, Ser: 132 mg/dL (ref 83–199)

## 2022-11-26 LAB — HEPATIC FUNCTION PANEL
AG Ratio: 1.4 (calc) (ref 1.0–2.5)
ALT: 174 U/L — ABNORMAL HIGH (ref 9–46)
AST: 59 U/L — ABNORMAL HIGH (ref 10–40)
Albumin: 4.8 g/dL (ref 3.6–5.1)
Alkaline phosphatase (APISO): 104 U/L (ref 36–130)
Bilirubin, Direct: 0.1 mg/dL (ref 0.0–0.2)
Globulin: 3.4 g/dL (calc) (ref 1.9–3.7)
Indirect Bilirubin: 0.5 mg/dL (calc) (ref 0.2–1.2)
Total Bilirubin: 0.6 mg/dL (ref 0.2–1.2)
Total Protein: 8.2 g/dL — ABNORMAL HIGH (ref 6.1–8.1)

## 2022-11-26 LAB — IRON,TIBC AND FERRITIN PANEL
%SAT: 58 % (calc) — ABNORMAL HIGH (ref 20–48)
Ferritin: 637 ng/mL — ABNORMAL HIGH (ref 38–380)
Iron: 172 ug/dL (ref 50–180)
TIBC: 297 mcg/dL (calc) (ref 250–425)

## 2022-11-26 LAB — ANTI-SMOOTH MUSCLE ANTIBODY, IGG: Actin (Smooth Muscle) Antibody (IGG): <20 U (ref <20)

## 2022-11-26 LAB — ANTI-NUCLEAR AB-TITER (ANA TITER): ANA Titer 1: 1:40 {titer} — ABNORMAL HIGH

## 2022-11-26 LAB — ANA: Anti Nuclear Antibody (ANA): POSITIVE — AB

## 2022-11-26 LAB — MITOCHONDRIAL ANTIBODIES: Mitochondrial M2 Ab, IgG: <=20 U (ref <=20.0)

## 2022-11-26 LAB — IGA: Immunoglobulin A: 253 mg/dL (ref 47–310)

## 2022-11-26 LAB — TISSUE TRANSGLUTAMINASE ABS,IGG,IGA
(tTG) Ab, IgA: <1 U/mL
(tTG) Ab, IgG: <1 U/mL

## 2022-11-27 ENCOUNTER — Other Ambulatory Visit: Payer: Self-pay | Admitting: Physician Assistant

## 2022-11-27 DIAGNOSIS — R748 Abnormal levels of other serum enzymes: Secondary | ICD-10-CM

## 2022-11-27 DIAGNOSIS — R768 Other specified abnormal immunological findings in serum: Secondary | ICD-10-CM

## 2022-11-27 DIAGNOSIS — R7989 Other specified abnormal findings of blood chemistry: Secondary | ICD-10-CM

## 2022-11-27 DIAGNOSIS — K760 Fatty (change of) liver, not elsewhere classified: Secondary | ICD-10-CM

## 2022-11-27 NOTE — Progress Notes (Signed)
Your liver enzymes remain elevated but down a little. Your ferritin is high which can mean inflammation and ANA is positive which can mean some type of autoimmune process. Referral made to GI for further consult.

## 2022-12-11 ENCOUNTER — Telehealth: Payer: Self-pay | Admitting: Physician Assistant

## 2022-12-11 NOTE — Telephone Encounter (Signed)
OK with me.

## 2022-12-11 NOTE — Telephone Encounter (Signed)
Pt called requesting to switch PCPs to Dr. Madilyn Fireman. Is it okay to switch providers?

## 2022-12-24 DIAGNOSIS — R140 Abdominal distension (gaseous): Secondary | ICD-10-CM | POA: Diagnosis not present

## 2022-12-24 DIAGNOSIS — K760 Fatty (change of) liver, not elsewhere classified: Secondary | ICD-10-CM | POA: Diagnosis not present

## 2022-12-24 DIAGNOSIS — K824 Cholesterolosis of gallbladder: Secondary | ICD-10-CM | POA: Diagnosis not present

## 2022-12-26 DIAGNOSIS — K824 Cholesterolosis of gallbladder: Secondary | ICD-10-CM | POA: Diagnosis not present

## 2022-12-26 DIAGNOSIS — Z1330 Encounter for screening examination for mental health and behavioral disorders, unspecified: Secondary | ICD-10-CM | POA: Diagnosis not present

## 2023-04-02 DIAGNOSIS — K760 Fatty (change of) liver, not elsewhere classified: Secondary | ICD-10-CM | POA: Diagnosis not present

## 2023-04-02 DIAGNOSIS — R140 Abdominal distension (gaseous): Secondary | ICD-10-CM | POA: Diagnosis not present

## 2023-04-16 ENCOUNTER — Other Ambulatory Visit: Payer: Self-pay

## 2023-04-16 MED ORDER — LEVOTHYROXINE SODIUM 88 MCG PO TABS: 88.0000 ug | ORAL_TABLET | Freq: Every day | ORAL | 0 refills | Status: DC

## 2023-05-22 ENCOUNTER — Other Ambulatory Visit: Payer: Self-pay | Admitting: Sports Medicine

## 2023-06-24 ENCOUNTER — Other Ambulatory Visit: Payer: Self-pay | Admitting: Sports Medicine

## 2023-06-26 DIAGNOSIS — K760 Fatty (change of) liver, not elsewhere classified: Secondary | ICD-10-CM | POA: Diagnosis not present

## 2023-06-26 DIAGNOSIS — R160 Hepatomegaly, not elsewhere classified: Secondary | ICD-10-CM | POA: Diagnosis not present

## 2023-06-26 DIAGNOSIS — K824 Cholesterolosis of gallbladder: Secondary | ICD-10-CM | POA: Diagnosis not present

## 2023-07-03 DIAGNOSIS — K824 Cholesterolosis of gallbladder: Secondary | ICD-10-CM | POA: Diagnosis not present

## 2023-07-21 DIAGNOSIS — K3189 Other diseases of stomach and duodenum: Secondary | ICD-10-CM | POA: Diagnosis not present

## 2023-07-21 DIAGNOSIS — R140 Abdominal distension (gaseous): Secondary | ICD-10-CM | POA: Diagnosis not present

## 2023-07-21 DIAGNOSIS — K2100 Gastro-esophageal reflux disease with esophagitis, without bleeding: Secondary | ICD-10-CM | POA: Diagnosis not present

## 2023-07-31 ENCOUNTER — Other Ambulatory Visit: Payer: Self-pay | Admitting: Sports Medicine

## 2023-09-15 ENCOUNTER — Telehealth: Payer: Self-pay | Admitting: Family Medicine

## 2023-09-15 MED ORDER — LEVOTHYROXINE SODIUM 88 MCG PO TABS: 88.0000 ug | ORAL_TABLET | Freq: Every day | ORAL | 0 refills | Status: DC

## 2023-09-15 NOTE — Telephone Encounter (Signed)
That is fine 

## 2023-09-15 NOTE — Telephone Encounter (Signed)
Pharmacy is requesting a new prescription for Levothyroxine please submit to [facility] on [address] [place] [address] Phone [phone]

## 2023-09-15 NOTE — Telephone Encounter (Signed)
Please advise pt that he is due for OV and updated labs. Thanks.

## 2023-10-15 ENCOUNTER — Ambulatory Visit: Payer: BC Managed Care – PPO | Admitting: Family Medicine

## 2023-10-15 VITALS — BP 126/77 | HR 76 | Ht 63.0 in | Wt 234.0 lb

## 2023-10-15 DIAGNOSIS — K760 Fatty (change of) liver, not elsewhere classified: Secondary | ICD-10-CM | POA: Diagnosis not present

## 2023-10-15 DIAGNOSIS — E039 Hypothyroidism, unspecified: Secondary | ICD-10-CM

## 2023-10-15 DIAGNOSIS — Z13220 Encounter for screening for lipoid disorders: Secondary | ICD-10-CM

## 2023-10-15 DIAGNOSIS — R748 Abnormal levels of other serum enzymes: Secondary | ICD-10-CM

## 2023-10-15 MED ORDER — LEVOTHYROXINE SODIUM 88 MCG PO TABS
88.0000 ug | ORAL_TABLET | Freq: Every day | ORAL | 1 refills | Status: DC
Start: 2023-10-15 — End: 2024-05-18

## 2023-10-15 NOTE — Assessment & Plan Note (Signed)
Continue to work on healthy diet and regular exercise and weight loss.  That is his goal for this next year.

## 2023-10-15 NOTE — Assessment & Plan Note (Signed)
To work on improving diet and working on regular exercise and weight loss.

## 2023-10-15 NOTE — Progress Notes (Signed)
   Established Patient Office Visit  Subjective   Patient ID: Tyrone Johnson, male    DOB: [birth year]  Age: 42 y.o. MRN: [id number]  Chief Complaint  Patient presents with   Medical Management of Chronic Issues    HPI  Hypothyroidism - Taking medication regularly in the AM away from food and vitamins, etc. No recent change to skin, hair, or energy levels.  He ran out of his medication about a month ago.  He says he has gained a little bit of weight over the last year.  And he knows he needs to really work on that.  F/U elevated liver enzymes. + fatty liver. Saw GI. Dx with GB polyp.  Still struggling with bloating.   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    ROS    Objective:     BP 126/77   Pulse 76   Ht 5\' 3"  (1.6 m)   Wt 234 lb (106.1 kg)   SpO2 98%   BMI 41.45 kg/m    Physical Exam Vitals and nursing note reviewed.  Constitutional:      Appearance: Normal appearance.  HENT:     Head: Normocephalic and atraumatic.  Eyes:     Conjunctiva/sclera: Conjunctivae normal.  Cardiovascular:     Rate and Rhythm: Normal rate and regular rhythm.  Pulmonary:     Effort: Pulmonary effort is normal.     Breath sounds: Normal breath sounds.  Skin:    General: Skin is warm and dry.  Neurological:     Mental Status: He is alert.  Psychiatric:        Mood and Affect: Mood normal.      No results found for any visits on 10/15/23.    The 10-year ASCVD risk score (Arnett DK, et al., 2019) is: 1.1%    Assessment & Plan:   Problem List Items Addressed This Visit       Digestive   Fatty liver    To work on improving diet and working on regular exercise and weight loss.      Relevant Orders   CMP14+EGFR     Endocrine   Hypothyroidism - Primary    Plan to recheck TSH.  It will probably be off a little bit because he has been off the medicine for months we will get him restarted on the 88 mcg and he will  likely need a repeat check in about 6 months.      Relevant Medications   levothyroxine (SYNTHROID) 88 MCG tablet   Other Relevant Orders   TSH   CMP14+EGFR   Lipid Panel With LDL/HDL Ratio     Other   Obesity, Class III, BMI 40-49.9 (morbid obesity) (HCC)    Continue to work on healthy diet and regular exercise and weight loss.  That is his goal for this next year.      Other Visit Diagnoses     Screening for lipid disorders       Relevant Orders   TSH   CMP14+EGFR   Lipid Panel With LDL/HDL Ratio   Elevated liver enzymes       Relevant Orders   CMP14+EGFR       Return in about 1 year (around 10/14/2024) for thyroid and liver .    Nani Gasser, MD

## 2023-10-15 NOTE — Assessment & Plan Note (Signed)
Plan to recheck TSH.  It will probably be off a little bit because he has been off the medicine for months we will get him restarted on the 88 mcg and he will likely need a repeat check in about 6 months.

## 2023-10-16 ENCOUNTER — Other Ambulatory Visit: Payer: Self-pay | Admitting: *Deleted

## 2023-10-16 DIAGNOSIS — E039 Hypothyroidism, unspecified: Secondary | ICD-10-CM

## 2023-10-16 LAB — CMP14+EGFR
ALT: 167 [IU]/L — ABNORMAL HIGH (ref 0–44)
AST: 53 [IU]/L — ABNORMAL HIGH (ref 0–40)
Albumin: 4.5 g/dL (ref 4.1–5.1)
Alkaline Phosphatase: 140 [IU]/L — ABNORMAL HIGH (ref 44–121)
BUN/Creatinine Ratio: 13 (ref 9–20)
BUN: 11 mg/dL (ref 6–24)
Bilirubin Total: 0.4 mg/dL (ref 0.0–1.2)
CO2: 21 mmol/L (ref 20–29)
Calcium: 9.2 mg/dL (ref 8.7–10.2)
Chloride: 105 mmol/L (ref 96–106)
Creatinine, Ser: 0.85 mg/dL (ref 0.76–1.27)
Globulin, Total: 2.7 g/dL (ref 1.5–4.5)
Glucose: 116 mg/dL — ABNORMAL HIGH (ref 70–99)
Potassium: 4.1 mmol/L (ref 3.5–5.2)
Sodium: 141 mmol/L (ref 134–144)
Total Protein: 7.2 g/dL (ref 6.0–8.5)
eGFR: 113 mL/min/{1.73_m2} (ref >59)

## 2023-10-16 LAB — LIPID PANEL WITH LDL/HDL RATIO
Cholesterol, Total: 221 mg/dL — ABNORMAL HIGH (ref 100–199)
HDL: 50 mg/dL (ref >39)
LDL Chol Calc (NIH): 135 mg/dL — ABNORMAL HIGH (ref 0–99)
LDL/HDL Ratio: 2.7 {ratio} (ref 0.0–3.6)
Triglycerides: 199 mg/dL — ABNORMAL HIGH (ref 0–149)
VLDL Cholesterol Cal: 36 mg/dL (ref 5–40)

## 2023-10-16 LAB — TSH: TSH: 7.2 u[IU]/mL — ABNORMAL HIGH (ref 0.450–4.500)

## 2023-10-16 NOTE — Progress Notes (Signed)
HI Tyrone Johnson, thyroid levels up just a little bit but that is what I would expect since you were off the medicine for a few weeks.  I think when she get back on you will notice improvement we can plan to recheck your level in 2 to 3 months after you restart the medication just to make sure you are on the right dose.  Your liver enzymes are still elevated but similar to what they have been over the last year.  So we will definitely keep an eye on that and make sure to follow-up with GI if they have you scheduled to follow-up next year.  Total cholesterol and LDL are mildly elevated as well as her triglycerides just really encouraged her to continue to work on healthy diet and regular exercise as that will also help reduce inflammation in your liver.

## 2024-03-25 ENCOUNTER — Ambulatory Visit: Admitting: Family Medicine

## 2024-05-18 ENCOUNTER — Other Ambulatory Visit: Payer: Self-pay | Admitting: Family Medicine

## 2024-05-18 DIAGNOSIS — E039 Hypothyroidism, unspecified: Secondary | ICD-10-CM

## 2024-07-06 ENCOUNTER — Encounter: Payer: Self-pay | Admitting: Sports Medicine

## 2024-10-05 ENCOUNTER — Ambulatory Visit: Admitting: Family Medicine

## 2024-10-14 ENCOUNTER — Ambulatory Visit: Admitting: Family Medicine

## 2024-10-15 ENCOUNTER — Ambulatory Visit: Payer: BC Managed Care – PPO | Admitting: Family Medicine

## 2024-11-22 ENCOUNTER — Encounter: Payer: Self-pay | Admitting: Family Medicine

## 2024-11-22 ENCOUNTER — Ambulatory Visit (INDEPENDENT_AMBULATORY_CARE_PROVIDER_SITE_OTHER): Admitting: Family Medicine

## 2024-11-22 VITALS — BP 121/75 | HR 71 | Ht 63.0 in | Wt 235.0 lb

## 2024-11-22 DIAGNOSIS — K625 Hemorrhage of anus and rectum: Secondary | ICD-10-CM | POA: Diagnosis not present

## 2024-11-22 DIAGNOSIS — K760 Fatty (change of) liver, not elsewhere classified: Secondary | ICD-10-CM | POA: Diagnosis not present

## 2024-11-22 DIAGNOSIS — E039 Hypothyroidism, unspecified: Secondary | ICD-10-CM

## 2024-11-22 DIAGNOSIS — Z0000 Encounter for general adult medical examination without abnormal findings: Secondary | ICD-10-CM

## 2024-11-22 NOTE — Assessment & Plan Note (Signed)
Due to recheck liver enzymes. 

## 2024-11-22 NOTE — Progress Notes (Signed)
 "  Established Patient Office Visit  Patient ID: Tyrone Johnson, male    DOB: 06/20/1981  Age: 44 y.o. MRN: [id number] PCP: Alvan Dorothyann BIRCH, MD  Chief Complaint  Patient presents with   Annual Exam    Subjective:     HPI  Discussed the use of AI scribe software for clinical note transcription with the patient, who gave verbal consent to proceed.  History of Present Illness Tyrone Johnson is a 44 year old male with thyroid  problems who presents for a routine physical exam and concerns about weight gain.  Weight gain - Progressive weight gain over the past three years - Attributes weight gain to decreased physical activity following a change in work environment  Thyroid  dysfunction - Currently taking levothyroxine  for thyroid  problems  Gastrointestinal symptoms - Occasional loose stools associated with stress and consumption of dairy products such as cheese and milk - Decreased tolerance for dairy with age, resulting in bloating and loose stools  Hemorrhoidal bleeding - History of hemorrhoids with occasional episodes of bright red blood during bowel movements - Episodes occur infrequently, with the last episode one to two months ago - No associated discomfort or pain  Sleep disturbance - Occasional difficulty sleeping due to early work schedule requiring awakening at 5:40 AM  General review of systems - No recent chest pain or breathing problems - No changes in skin or moles - No other significant symptoms reported     ROS    Objective:     BP 121/75   Pulse 71   Ht 5' 3 (1.6 m)   Wt 235 lb 0.6 oz (106.6 kg)   SpO2 96%   BMI 41.64 kg/m    Physical Exam Vitals and nursing note reviewed.  Constitutional:      Appearance: Normal appearance.  HENT:     Head: Normocephalic and atraumatic.     Right Ear: Tympanic membrane, ear canal and external ear normal.     Left Ear: Tympanic membrane, ear canal and external ear normal.     Nose:  Nose normal.     Mouth/Throat:     Pharynx: Oropharynx is clear.  Eyes:     Extraocular Movements: Extraocular movements intact.     Conjunctiva/sclera: Conjunctivae normal.     Pupils: Pupils are equal, round, and reactive to light.  Neck:     Thyroid : No thyromegaly.  Cardiovascular:     Rate and Rhythm: Normal rate and regular rhythm.  Pulmonary:     Effort: Pulmonary effort is normal.     Breath sounds: Normal breath sounds.  Abdominal:     General: Bowel sounds are normal.     Palpations: Abdomen is soft.     Tenderness: There is no abdominal tenderness.  Musculoskeletal:        General: No swelling.     Cervical back: Neck supple.  Skin:    General: Skin is warm and dry.  Neurological:     Mental Status: He is alert and oriented to person, place, and time.  Psychiatric:        Mood and Affect: Mood normal.        Behavior: Behavior normal.      No results found for any visits on 11/22/24.    The 10-year ASCVD risk score (Arnett DK, et al., 2019) is: 1.4%    Assessment & Plan:   Problem List Items Addressed This Visit       Digestive   Fatty liver  Due to recheck liver enzymes.      Relevant Orders   CMP14+EGFR     Endocrine   Hypothyroidism   Relevant Orders   TSH   Other Visit Diagnoses       Routine general medical examination at a health care facility    -  Primary   Relevant Orders   TSH   Lipid Panel With LDL/HDL Ratio   CMP14+EGFR   CBC with Differential/Platelet     Rectal bleeding       Relevant Orders   Ambulatory referral to Gastroenterology       Assessment and Plan Assessment & Plan Wellness exam-continue regular exercise and maybe add in some resistance training.  Continue to work on healthy diet his girlfriend is very supportive of that.  They mostly eat at home.  Will get updated labs today.  Hypothyroidism Current levothyroxine  dose may need adjustment. - Ordered TSH level to assess thyroid  function. - Will adjust  levothyroxine  dose if TSH is abnormal.  Hemorrhoids Intermittent bright red blood per rectum likely due to internal hemorrhoids. Discussed potential need for surgical evaluation if symptoms persist or worsen. GI evaluation recommended to confirm diagnosis. - Referred to GI for evaluation of hemorrhoids. - Advised on dietary fiber and hydration to maintain soft stools.  Hyperlipidemia Routine monitoring of lipid levels. - Ordered lipid panel to assess cholesterol levels.    Return in about 6 months (around 05/22/2025) for Wellness Exam.    Dorothyann Byars, MD [facility] at [facility]   "

## 2024-11-23 ENCOUNTER — Ambulatory Visit: Payer: Self-pay | Admitting: Family Medicine

## 2024-11-23 LAB — TSH: TSH: 2.54 u[IU]/mL (ref 0.450–4.500)

## 2024-11-23 LAB — CBC WITH DIFFERENTIAL/PLATELET
Basophils Absolute: 0 x10E3/uL (ref 0.0–0.2)
Basos: 1 %
EOS (ABSOLUTE): 0.2 x10E3/uL (ref 0.0–0.4)
Eos: 3 %
Hematocrit: 48.8 % (ref 37.5–51.0)
Hemoglobin: 16.7 g/dL (ref 13.0–17.7)
Immature Grans (Abs): 0 x10E3/uL (ref 0.0–0.1)
Immature Granulocytes: 0 %
Lymphocytes Absolute: 2.2 x10E3/uL (ref 0.7–3.1)
Lymphs: 40 %
MCH: 32.7 pg (ref 26.6–33.0)
MCHC: 34.2 g/dL (ref 31.5–35.7)
MCV: 96 fL (ref 79–97)
Monocytes Absolute: 0.4 x10E3/uL (ref 0.1–0.9)
Monocytes: 7 %
Neutrophils Absolute: 2.6 x10E3/uL (ref 1.4–7.0)
Neutrophils: 49 %
Platelets: 181 x10E3/uL (ref 150–450)
RBC: 5.11 x10E6/uL (ref 4.14–5.80)
RDW: 13.5 % (ref 11.6–15.4)
WBC: 5.4 x10E3/uL (ref 3.4–10.8)

## 2024-11-23 LAB — CMP14+EGFR
ALT: 197 IU/L — ABNORMAL HIGH (ref 0–44)
AST: 73 IU/L — ABNORMAL HIGH (ref 0–40)
Albumin: 4.2 g/dL (ref 4.1–5.1)
Alkaline Phosphatase: 133 IU/L — ABNORMAL HIGH (ref 47–123)
BUN/Creatinine Ratio: 13 (ref 9–20)
BUN: 12 mg/dL (ref 6–24)
Bilirubin Total: 0.6 mg/dL (ref 0.0–1.2)
CO2: 21 mmol/L (ref 20–29)
Calcium: 9.4 mg/dL (ref 8.7–10.2)
Chloride: 104 mmol/L (ref 96–106)
Creatinine, Ser: 0.89 mg/dL (ref 0.76–1.27)
Globulin, Total: 3.1 g/dL (ref 1.5–4.5)
Glucose: 108 mg/dL — ABNORMAL HIGH (ref 70–99)
Potassium: 4.3 mmol/L (ref 3.5–5.2)
Sodium: 141 mmol/L (ref 134–144)
Total Protein: 7.3 g/dL (ref 6.0–8.5)
eGFR: 110 mL/min/1.73

## 2024-11-23 LAB — LIPID PANEL WITH LDL/HDL RATIO
Cholesterol, Total: 204 mg/dL — ABNORMAL HIGH (ref 100–199)
HDL: 41 mg/dL
LDL Chol Calc (NIH): 123 mg/dL — ABNORMAL HIGH (ref 0–99)
LDL/HDL Ratio: 3 ratio (ref 0.0–3.6)
Triglycerides: 228 mg/dL — ABNORMAL HIGH (ref 0–149)
VLDL Cholesterol Cal: 40 mg/dL (ref 5–40)

## 2024-11-23 NOTE — Progress Notes (Signed)
 Hi Kadir, total cholesterol and LDL look better than last year, great work on bringing that down.  Your good cholesterol has dropped a little bit though.  Usually that is better when you are working out regularly.  Your liver enzymes are still very elevated.  In fact they are up just slightly.  I would like to consider getting gastroenterology involved.  I know we sent a referral yesterday specifically for the rectal bleeding but I would like to also get a consultation with them in regards to the liver enzymes.  Your thyroid  level looks great.  Blood count is normal no sign of anemia or infection.

## 2024-12-02 NOTE — Progress Notes (Signed)
 Good morning,   Sorry I placed a new order. Add elevated liver enzymes. Thank you

## 2024-12-02 NOTE — Addendum Note (Signed)
 Addended byBETHA DUWAINE RIGGS on: 12/02/2024 08:24 AM   Modules accepted: Orders

## 2025-05-24 ENCOUNTER — Encounter: Admitting: Family Medicine
